# Patient Record
Sex: Male | Born: 1990 | Race: Black or African American | Hispanic: No | Marital: Married | Smoking: Never smoker
Health system: Southern US, Community
[De-identification: ages and names within clinical notes are randomized; demographics above are authoritative.]

## PROBLEM LIST (undated history)

## (undated) DIAGNOSIS — M459 Ankylosing spondylitis of unspecified sites in spine: Secondary | ICD-10-CM

## (undated) DIAGNOSIS — G8929 Other chronic pain: Secondary | ICD-10-CM

## (undated) DIAGNOSIS — M25559 Pain in unspecified hip: Secondary | ICD-10-CM

## (undated) HISTORY — PX: WISDOM TOOTH EXTRACTION: SHX21

---
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
## 2009-08-27 ENCOUNTER — Encounter: Admission: RE | Admit: 2009-08-27 | Discharge: 2009-08-27 | Payer: Self-pay | Admitting: Family Medicine

## 2009-10-01 ENCOUNTER — Encounter: Admission: RE | Admit: 2009-10-01 | Discharge: 2009-10-14 | Payer: Self-pay | Admitting: Orthopedic Surgery

## 2011-09-20 LAB — I-STAT 8, (EC8 V) (CONVERTED LAB)
BUN: 15
Bicarbonate: 24.3 — ABNORMAL HIGH
Chloride: 107
Glucose, Bld: 94
HCT: 43
Hemoglobin: 14.6
Operator id: 198171
Potassium: 3.8
Sodium: 140
TCO2: 25
pCO2, Ven: 36.9 — ABNORMAL LOW
pH, Ven: 7.427 — ABNORMAL HIGH

## 2011-09-20 LAB — D-DIMER, QUANTITATIVE: D-Dimer, Quant: <0.22

## 2011-09-20 LAB — POCT I-STAT CREATININE: Operator id: 198171

## 2014-06-14 ENCOUNTER — Emergency Department (INDEPENDENT_AMBULATORY_CARE_PROVIDER_SITE_OTHER)
Admission: EM | Admit: 2014-06-14 | Discharge: 2014-06-14 | Disposition: A | Discharge status: Home / Self Care | Payer: Self-pay | Source: Home / Self Care | Attending: Family Medicine | Admitting: Family Medicine

## 2014-06-14 ENCOUNTER — Encounter (HOSPITAL_COMMUNITY): Payer: Self-pay | Admitting: Emergency Medicine

## 2014-06-14 DIAGNOSIS — R059 Cough, unspecified: Secondary | ICD-10-CM

## 2014-06-14 DIAGNOSIS — R0609 Other forms of dyspnea: Secondary | ICD-10-CM

## 2014-06-14 DIAGNOSIS — R05 Cough: Secondary | ICD-10-CM

## 2014-06-14 DIAGNOSIS — R0683 Snoring: Secondary | ICD-10-CM

## 2014-06-14 DIAGNOSIS — R51 Headache: Secondary | ICD-10-CM

## 2014-06-14 DIAGNOSIS — R053 Chronic cough: Secondary | ICD-10-CM

## 2014-06-14 DIAGNOSIS — G8929 Other chronic pain: Secondary | ICD-10-CM

## 2014-06-14 DIAGNOSIS — R0989 Other specified symptoms and signs involving the circulatory and respiratory systems: Secondary | ICD-10-CM

## 2014-06-14 MED ORDER — OMEPRAZOLE 40 MG PO CPDR: 40.0000 mg | DELAYED_RELEASE_CAPSULE | Freq: Every day | ORAL | Status: DC

## 2014-06-14 MED ORDER — FLUTICASONE PROPIONATE 50 MCG/ACT NA SUSP: 2.0000 | Freq: Every day | NASAL | Status: DC

## 2014-06-14 NOTE — ED Provider Notes (Signed)
Cristian Mercado is a 23 y.o. male who presents to Urgent Care today for cough. Patient notes a chronic cough occurring for the last 3 or 4 months. The cough is worse at night and is associated with acid reflux symptoms. Use she notes a mild headache occurring for the last several months associated with cough as well. His partner notes that he snores loudly. No fevers or chills nausea vomiting or diarrhea. Mucinex and honey have not been helpful. No wheezing or history of asthma. No weakness or numbness loss of function or balance. Normal coordination.   History reviewed. No pertinent past medical history. History  Substance Use Topics  . Smoking status: Never Smoker   . Smokeless tobacco: Not on file  . Alcohol Use: No   ROS as above Medications: No current facility-administered medications for this encounter.   Current Outpatient Prescriptions  Medication Sig Dispense Refill  . fluticasone (FLONASE) 50 MCG/ACT nasal spray Place 2 sprays into both nostrils daily.  16 g  3  . omeprazole (PRILOSEC) 40 MG capsule Take 1 capsule (40 mg total) by mouth daily.  30 capsule  3    Exam:  BP 128/81  Pulse 70  Temp(Src) 98.7 F (37.1 C) (Oral)  Resp 16  SpO2 97% Gen: Well NAD HEENT: EOMI,  MMM posterior pharynx with tonsillar hypertrophy bilaterally and cobblestoning. Tympanic membranes are normal appearing bilaterally. Lungs: Normal work of breathing. CTABL Heart: RRR no MRG Abd: NABS, Soft. NT, ND Exts: Brisk capillary refill, warm and well perfused.  Neuro: Alert and oriented normal gait balance and coordination.  No results found for this or any previous visit (from the past 24 hour(s)). No results found.  Assessment and Plan: 23 y.o. male with chronic cough likely due to acid reflux with possible postnasal drip. Will treat with omeprazole and Flonase nasal spray. Recommend patient followup with ear nose and throat when he has health insurance to consider tonsillectomy for possible  sleep apnea. Watchful waiting for headaches. Hopeful for resolution with resolution of cough.  Discussed warning signs or symptoms. Please see discharge instructions. Patient expresses understanding.    Rodolph BongEvan S Jerome Viglione, MD 06/14/14 [id number]

## 2014-06-14 NOTE — Discharge Instructions (Signed)
Thank you for coming in today. Call or go to the emergency room if you get worse, have trouble breathing, have chest pains, or palpitations.  Go to the emergency room if your headache becomes excruciating or you have weakness or numbness or uncontrolled vomiting.   Please call or see Ms Antionette Char for assistance with your bill.  You may qualify for reduced or free services.  Her phone number is [phone]. Her email is [email or web address]   Gastroesophageal Reflux Disease, Adult Gastroesophageal reflux disease (GERD) happens when acid from your stomach flows up into the esophagus. When acid comes in contact with the esophagus, the acid causes soreness (inflammation) in the esophagus. Over time, GERD may create small holes (ulcers) in the lining of the esophagus. CAUSES   Increased body weight. This puts pressure on the stomach, making acid rise from the stomach into the esophagus.  Smoking. This increases acid production in the stomach.  Drinking alcohol. This causes decreased pressure in the lower esophageal sphincter (valve or ring of muscle between the esophagus and stomach), allowing acid from the stomach into the esophagus.  Late evening meals and a full stomach. This increases pressure and acid production in the stomach.  A malformed lower esophageal sphincter. Sometimes, no cause is found. SYMPTOMS   Burning pain in the lower part of the mid-chest behind the breastbone and in the mid-stomach area. This may occur twice a week or more often.  Trouble swallowing.  Sore throat.  Dry cough.  Asthma-like symptoms including chest tightness, shortness of breath, or wheezing. DIAGNOSIS  Your caregiver may be able to diagnose GERD based on your symptoms. In some cases, X-rays and other tests may be done to check for complications or to check the condition of your stomach and esophagus. TREATMENT  Your caregiver may recommend over-the-counter or prescription  medicines to help decrease acid production. Ask your caregiver before starting or adding any new medicines.  HOME CARE INSTRUCTIONS   Change the factors that you can control. Ask your caregiver for guidance concerning weight loss, quitting smoking, and alcohol consumption.  Avoid foods and drinks that make your symptoms worse, such as:  Caffeine or alcoholic drinks.  Chocolate.  Peppermint or mint flavorings.  Garlic and onions.  Spicy foods.  Citrus fruits, such as oranges, lemons, or limes.  Tomato-based foods such as sauce, chili, salsa, and pizza.  Fried and fatty foods.  Avoid lying down for the 3 hours prior to your bedtime or prior to taking a nap.  Eat small, frequent meals instead of large meals.  Wear loose-fitting clothing. Do not wear anything tight around your waist that causes pressure on your stomach.  Raise the head of your bed 6 to 8 inches with wood blocks to help you sleep. Extra pillows will not help.  Only take over-the-counter or prescription medicines for pain, discomfort, or fever as directed by your caregiver.  Do not take aspirin, ibuprofen, or other nonsteroidal anti-inflammatory drugs (NSAIDs). SEEK IMMEDIATE MEDICAL CARE IF:   You have pain in your arms, neck, jaw, teeth, or back.  Your pain increases or changes in intensity or duration.  You develop nausea, vomiting, or sweating (diaphoresis).  You develop shortness of breath, or you faint.  Your vomit is green, yellow, black, or looks like coffee grounds or blood.  Your stool is red, bloody, or black. These symptoms could be signs of other problems, such as heart disease, gastric bleeding, or esophageal bleeding. MAKE SURE YOU:   Understand these instructions.  Will watch your condition.  Will get help right away if you are not doing well or get worse. Document Released: 09/08/2005 Document Revised: 02/21/2012 Document Reviewed: 06/18/2011 [facility] Patient Information 2015  [place], [place]. This information is not intended to replace advice given to you by your health care provider. Make sure you discuss any questions you have with your health care provider.   Cough, Adult  A cough is a reflex that helps clear your throat and airways. It can help heal the body or may be a reaction to an irritated airway. A cough may only last 2 or 3 weeks (acute) or may last more than 8 weeks (chronic).  CAUSES Acute cough:  Viral or bacterial infections. Chronic cough:  Infections.  Allergies.  Asthma.  Post-nasal drip.  Smoking.  Heartburn or acid reflux.  Some medicines.  Chronic lung problems (COPD).  Cancer. SYMPTOMS   Cough.  Fever.  Chest pain.  Increased breathing rate.  High-pitched whistling sound when breathing (wheezing).  Colored mucus that you cough up (sputum). TREATMENT   A bacterial cough may be treated with antibiotic medicine.  A viral cough must run its course and will not respond to antibiotics.  Your caregiver may recommend other treatments if you have a chronic cough. HOME CARE INSTRUCTIONS   Only take over-the-counter or prescription medicines for pain, discomfort, or fever as directed by your caregiver. Use cough suppressants only as directed by your caregiver.  Use a cold steam vaporizer or humidifier in your bedroom or home to help loosen secretions.  Sleep in a semi-upright position if your cough is worse at night.  Rest as needed.  Stop smoking if you smoke. SEEK IMMEDIATE MEDICAL CARE IF:   You have pus in your sputum.  Your cough starts to worsen.  You cannot control your cough with suppressants and are losing sleep.  You begin coughing up blood.  You have difficulty breathing.  You develop pain which is getting worse or is uncontrolled with medicine.  You have a fever. MAKE SURE YOU:   Understand these instructions.  Will watch your condition.  Will get help right away if you are not doing well  or get worse. Document Released: 05/28/2011 Document Revised: 02/21/2012 Document Reviewed: 05/28/2011 [facility] Patient Information 2015 [place], [place]. This information is not intended to replace advice given to you by your health care provider. Make sure you discuss any questions you have with your health care provider.  General Headache Without Cause A headache is pain or discomfort felt around the head or neck area. The specific cause of a headache may not be found. There are many causes and types of headaches. A few common ones are:  Tension headaches.  Migraine headaches.  Cluster headaches.  Chronic daily headaches. HOME CARE INSTRUCTIONS   Keep all follow-up appointments with your caregiver or any specialist referral.  Only take over-the-counter or prescription medicines for pain or discomfort as directed by your caregiver.  Lie down in a dark, quiet room when you have a headache.  Keep a headache journal to find out what may trigger your migraine headaches. For example, write down:  What you eat and drink.  How much sleep you get.  Any change to your diet or medicines.  Try massage or other relaxation techniques.  Put ice packs or heat on the head and neck. Use these 3 to 4 times per day for 15 to 20 minutes each time, or as needed.  Limit stress.  Sit up  straight, and do not tense your muscles.  Quit smoking if you smoke.  Limit alcohol use.  Decrease the amount of caffeine you drink, or stop drinking caffeine.  Eat and sleep on a regular schedule.  Get 7 to 9 hours of sleep, or as recommended by your caregiver.  Keep lights dim if bright lights bother you and make your headaches worse. SEEK MEDICAL CARE IF:   You have problems with the medicines you were prescribed.  Your medicines are not working.  You have a change from the usual headache.  You have nausea or vomiting. SEEK IMMEDIATE MEDICAL CARE IF:   Your headache becomes severe.  You have  a fever.  You have a stiff neck.  You have loss of vision.  You have muscular weakness or loss of muscle control.  You start losing your balance or have trouble walking.  You feel faint or pass out.  You have severe symptoms that are different from your first symptoms. MAKE SURE YOU:   Understand these instructions.  Will watch your condition.  Will get help right away if you are not doing well or get worse. Document Released: 11/29/2005 Document Revised: 02/21/2012 Document Reviewed: 12/15/2011 [facility] Patient Information 2015 [place], [place]. This information is not intended to replace advice given to you by your health care provider. Make sure you discuss any questions you have with your health care provider.

## 2014-06-14 NOTE — ED Notes (Signed)
C/o cough since March.  C/o headaches every other day since March.  Has tried cough syrup, honey and tea without relief.

## 2014-06-15 NOTE — ED Notes (Signed)
Patient called, asked for one more day extension of her RTW note. Granted, given Dr Denyse Amassorey history of granting time off of 1-3 days

## 2014-08-02 ENCOUNTER — Encounter (HOSPITAL_COMMUNITY): Payer: Self-pay | Admitting: Emergency Medicine

## 2014-08-02 ENCOUNTER — Emergency Department (HOSPITAL_COMMUNITY)
Admission: EM | Admit: 2014-08-02 | Discharge: 2014-08-02 | Disposition: A | Discharge status: Home / Self Care | Payer: Self-pay | Attending: Emergency Medicine | Admitting: Emergency Medicine

## 2014-08-02 ENCOUNTER — Emergency Department (HOSPITAL_COMMUNITY): Payer: Self-pay

## 2014-08-02 DIAGNOSIS — IMO0002 Reserved for concepts with insufficient information to code with codable children: Secondary | ICD-10-CM | POA: Insufficient documentation

## 2014-08-02 DIAGNOSIS — Z79899 Other long term (current) drug therapy: Secondary | ICD-10-CM | POA: Insufficient documentation

## 2014-08-02 DIAGNOSIS — Y9389 Activity, other specified: Secondary | ICD-10-CM | POA: Insufficient documentation

## 2014-08-02 DIAGNOSIS — Y9289 Other specified places as the place of occurrence of the external cause: Secondary | ICD-10-CM | POA: Insufficient documentation

## 2014-08-02 DIAGNOSIS — S0993XA Unspecified injury of face, initial encounter: Secondary | ICD-10-CM | POA: Insufficient documentation

## 2014-08-02 DIAGNOSIS — S199XXA Unspecified injury of neck, initial encounter: Secondary | ICD-10-CM

## 2014-08-02 DIAGNOSIS — W11XXXA Fall on and from ladder, initial encounter: Secondary | ICD-10-CM | POA: Insufficient documentation

## 2014-08-02 MED ORDER — NAPROXEN 500 MG PO TABS: 500.0000 mg | ORAL_TABLET | Freq: Two times a day (BID) | ORAL | Status: DC

## 2014-08-02 NOTE — Discharge Instructions (Signed)
You have been seen today for your complaint of pain after Fall Your imaging showed no fracture or abnormality. Your discharge medications include 1)NAPROXEN- please take your medication with food. Home care instructions are as follows:  Put ice on the injured area.  Put ice in a plastic bag.  Place a towel between your skin and the bag.  Leave the ice on for 15 to 20 minutes, 3 to 4 times a day.  Drink enough fluids to keep your urine clear or pale yellow. Do not drink alcohol.  Take a warm shower or bath once or twice a day. This will increase blood flow to sore muscles.  You may return to activities as directed by your caregiver. Be careful when lifting, as this may aggravate neck or back pain.  Only take over-the-counter or prescription medicines for pain, discomfort, or fever as directed by your caregiver. Do not use aspirin. This may increase bruising and bleeding.  Follow up with: Dr. Beverely LowPeter Kwiatowski or return to the emergency department Please seek immediate medical care if you develop any of the following symptoms: SEEK IMMEDIATE MEDICAL CARE IF:  You have numbness, tingling, or weakness in the arms or legs.  You develop severe headaches not relieved with medicine.  You have severe neck pain, especially tenderness in the middle of the back of your neck.  You have changes in bowel or bladder control.  There is increasing pain in any area of the body.  You have shortness of breath, lightheadedness, dizziness, or fainting.  You have chest pain.  You feel sick to your stomach (nauseous), throw up (vomit), or sweat.  You have increasing abdominal discomfort.  There is blood in your urine, stool, or vomit.  You have pain in your shoulder (shoulder strap areas).  You feel your symptoms are getting worse.

## 2014-08-02 NOTE — ED Notes (Signed)
Pt to FT with RN who will complete triage

## 2014-08-02 NOTE — ED Provider Notes (Signed)
CSN: [id number]     Arrival date & time 08/02/14  1244 History  This chart was scribed for non-physician practitioner [name], PA-C working with [name], DO by [name], ED Scribe. This patient was seen in room TR06C/TR06C and the patient's care was started at 1:15 PM.     Chief Complaint  Patient presents with  . Fall   The history is provided by the patient. No language interpreter was used.    HPI Comments: Cristian Mercado is a 23 y.o. male who presents to the Emergency Department complaining of constant, gradually worsening left posterior shoulder pain and right buttocks pain for the past 4 days. Patient states this pain began after a fall; he states he fell from a height of 8 ft while cleaning gutters, striking his back. He denies head injury or LOC. He has been taking Advil 400 mg each day with minimal relief. He denies numbness, weakness, HA, nausea.   History reviewed. No pertinent past medical history. History reviewed. No pertinent past surgical history. No family history on file. History  Substance Use Topics  . Smoking status: Never Smoker   . Smokeless tobacco: Not on file  . Alcohol Use: No    Review of Systems  Musculoskeletal: Positive for back pain. Negative for neck pain.  Skin: Negative for wound.  Neurological: Negative for syncope and weakness.      Allergies  Review of patient's allergies indicates no known allergies.  Home Medications   Prior to Admission medications   Medication Sig Start Date End Date Taking? Authorizing Provider  fluticasone (FLONASE) 50 MCG/ACT nasal spray Place 2 sprays into both nostrils daily. 06/14/14   Rodolph Bong, MD  omeprazole (PRILOSEC) 40 MG capsule Take 1 capsule (40 mg total) by mouth daily. 06/14/14   Rodolph Bong, MD   BP 127/83  Pulse 80  Temp(Src) 98.7 F (37.1 C) (Oral)  Resp 16  SpO2 99% Physical Exam  Nursing note and vitals reviewed. Constitutional: He is oriented to person, place, and time.  He appears well-developed and well-nourished.  HENT:  Head: Normocephalic and atraumatic.  Eyes: EOM are normal.  Neck: Normal range of motion. Neck supple. No tracheal deviation present.  Cardiovascular: Normal rate.   Pulmonary/Chest: Effort normal.  Abdominal: He exhibits no distension.  Musculoskeletal: He exhibits tenderness.  Midline thoracic spine tenderness about T2-3. Pain with movement of the left shoulder and flexion of the neck. No scapular tenderness. No midline cervical tenderness.   Tenderness over posterior right iliac crest. Full strength with flexion and extension, although this causes pain. Pain with passive flexion, extension, and external rotation predominately at the site of the ischial tuberosity. Able to stand and walks with antalgic gait. NVI.   Neurological: He is alert and oriented to person, place, and time.  Skin: Skin is warm and dry.  Psychiatric: He has a normal mood and affect.    ED Course  Procedures (including critical care time)  DIAGNOSTIC STUDIES: Oxygen Saturation is 99% on RA, normal by my interpretation.    COORDINATION OF CARE: 1:24 PM Discussed treatment plan with pt at bedside and pt agreed to plan.   Labs Review Labs Reviewed - No data to display  Imaging Review No results found.   EKG Interpretation None      MDM   Final diagnoses:  Fall from ladder, initial encounter    Patient without signs of serious head, neck, or back injury. Normal neurological exam. No concern for  closed head injury, lung injury, or intraabdominal injury. Normal muscle soreness after fall.  D/t pts normal radiology & ability to ambulate in ED pt will be dc home with symptomatic therapy. Pt has been instructed to follow up with their doctor if symptoms persist. Home conservative therapies for pain including ice and heat tx have been discussed. Pt is hemodynamically stable, in NAD, & able to ambulate in the ED. Pain has been managed & has no complaints  prior to dc.   I personally performed the services described in this documentation, which was scribed in my presence. The recorded information has been reviewed and is accurate.     Arthor CaptainAbigail Nylee Barbuto, PA-C 08/06/14 [phone]

## 2014-08-02 NOTE — ED Notes (Signed)
Pain in shoulder and hip since falling off a ladder Monday.  Fall from 10+ feet.  Pt continues to have soreness, ambulatory in triage

## 2014-08-02 NOTE — ED Notes (Signed)
Pt fell off a ladder approx 8 ft, on Monday. C/O right upper back/shoulder pain. Also has right posterior thigh pain. Ambulates without difficulty. No LOC.

## 2014-08-06 NOTE — ED Provider Notes (Signed)
Medical screening examination/treatment/procedure(s) were performed by non-physician practitioner and as supervising physician I was immediately available for consultation/collaboration.   EKG Interpretation None        Anavi Branscum, DO 08/06/14 1741 

## 2014-09-04 ENCOUNTER — Emergency Department (HOSPITAL_COMMUNITY)
Admission: EM | Admit: 2014-09-04 | Discharge: 2014-09-04 | Disposition: A | Discharge status: Home / Self Care | Payer: Self-pay | Attending: Emergency Medicine | Admitting: Emergency Medicine

## 2014-09-04 ENCOUNTER — Encounter (HOSPITAL_COMMUNITY): Payer: Self-pay | Admitting: Emergency Medicine

## 2014-09-04 DIAGNOSIS — R590 Localized enlarged lymph nodes: Secondary | ICD-10-CM

## 2014-09-04 DIAGNOSIS — Y9289 Other specified places as the place of occurrence of the external cause: Secondary | ICD-10-CM | POA: Insufficient documentation

## 2014-09-04 DIAGNOSIS — Y9389 Activity, other specified: Secondary | ICD-10-CM | POA: Insufficient documentation

## 2014-09-04 DIAGNOSIS — T20112A Burn of first degree of left ear [any part, except ear drum], initial encounter: Secondary | ICD-10-CM

## 2014-09-04 DIAGNOSIS — X19XXXA Contact with other heat and hot substances, initial encounter: Secondary | ICD-10-CM | POA: Insufficient documentation

## 2014-09-04 DIAGNOSIS — T20219A Burn of second degree of unspecified ear [any part, except ear drum], initial encounter: Secondary | ICD-10-CM | POA: Insufficient documentation

## 2014-09-04 MED ORDER — CEPHALEXIN 500 MG PO CAPS: 500.0000 mg | ORAL_CAPSULE | Freq: Three times a day (TID) | ORAL | Status: DC

## 2014-09-04 MED ORDER — CEPHALEXIN 500 MG PO CAPS
500.0000 mg | ORAL_CAPSULE | Freq: Once | ORAL | Status: AC
  Administered 2014-09-04: 500 mg via ORAL
  Filled 2014-09-04: qty 1

## 2014-09-04 MED ORDER — MUPIROCIN CALCIUM 2 % EX CREA
TOPICAL_CREAM | Freq: Two times a day (BID) | CUTANEOUS | Status: DC
  Administered 2014-09-04: 05:00:00 via TOPICAL
  Filled 2014-09-04: qty 15

## 2014-09-04 NOTE — ED Notes (Signed)
Pt. reports left outer ear skin itching with mild swelling onset yesterday , no drainage / denies fever .

## 2014-09-04 NOTE — Discharge Instructions (Signed)
Burn Care Burns hurt your skin. When your skin is hurt, it is easier to get an infection. Follow your doctor's directions to help prevent an infection. HOME CARE  Wash your hands well before you change your bandage.  Change your bandage as often as told by your doctor.  Remove the old bandage. If the bandage sticks, soak it off with cool, clean water.  Gently clean the burn with mild soap and water.  Pat the burn dry with a clean, dry cloth.  Put a thin layer of medicated cream on the burn.  Put a clean bandage on as told by your doctor.  Keep the bandage clean and dry.  Raise (elevate) the burn for the first 24 hours. After that, follow your doctor's directions.  Only take medicine as told by your doctor. GET HELP RIGHT AWAY IF:   You have too much pain.  The skin near the burn is red, tender, puffy (swollen), or has red streaks.  The burn area has yellowish white fluid (pus) or a bad smell coming from it.  You have a fever. MAKE SURE YOU:   Understand these instructions.  Will watch your condition.  Will get help right away if you are not doing well or get worse. Document Released: 09/07/2008 Document Revised: 02/21/2012 Document Reviewed: 04/21/2011 [facility] Patient Information 2015 [place], [place]. This information is not intended to replace advice given to you by your health care provider. Make sure you discuss any questions you have with your health care provider. Please apply a small amount of Bactroban ointment daily top of your left ear twice a day to keep the area moist.  It also been given a prescription for antibiotic take this as directed and to all tablets have been consumed. Return anytime, you develop fever, worsening pain, swelling, pus from the, area of the burn

## 2014-09-04 NOTE — ED Provider Notes (Signed)
CSN: [id number]     Arrival date & time 09/04/14  0327 History   First MD Initiated Contact with Patient 09/04/14 0408     No chief complaint on file.    (Consider location/radiation/quality/duration/timing/severity/associated sxs/prior Treatment) HPI Comments: After getting a haircut.  This, afternoon.  Patient noticed, that the upper aspect of his left ear started swelling.  He developed a painful lymph node in the posterior auricular area.  Denies any fever, trauma previous history of same. Patient does, state that when he was getting a haircut that helps his ear get warm from the clippers  The history is provided by the patient.    History reviewed. No pertinent past medical history. History reviewed. No pertinent past surgical history. No family history on file. History  Substance Use Topics  . Smoking status: Never Smoker   . Smokeless tobacco: Not on file  . Alcohol Use: No    Review of Systems  Constitutional: Negative for fever.  HENT: Positive for ear pain. Negative for ear discharge, facial swelling, sore throat and trouble swallowing.   Skin: Positive for wound.  Neurological: Negative for numbness.  All other systems reviewed and are negative.     Allergies  Review of patient's allergies indicates no known allergies.  Home Medications   Prior to Admission medications   Medication Sig Start Date End Date Taking? Authorizing Provider  cephALEXin (KEFLEX) 500 MG capsule Take 1 capsule (500 mg total) by mouth 3 (three) times daily. 09/04/14   Arman Filter, NP   BP 132/80  Pulse 68  Temp(Src) 97.7 F (36.5 C) (Oral)  Resp 18  SpO2 99% Physical Exam  Nursing note and vitals reviewed. Constitutional: He appears well-developed and well-nourished.  HENT:  Head: Normocephalic.  Right Ear: External ear normal.  Left Ear: No drainage. No mastoid tenderness. No decreased hearing is noted.  Ears:  Eyes: Pupils are equal, round, and reactive to light.  Neck:  Normal range of motion. Neck supple.  Cardiovascular: Normal rate and regular rhythm.   Musculoskeletal: Normal range of motion.  Lymphadenopathy:       Head (left side): Posterior auricular adenopathy present. No preauricular adenopathy present.  Neurological: He is alert.  Skin: Skin is warm and dry. Rash noted.  The pinna of his left ear it appears to be burned with fluctuant.  Blisters.  Minimal surrounding erythema and pain  Psychiatric: He has a normal mood and affect.    ED Course  Procedures (including critical care time) Labs Review Labs Reviewed - No data to display  Imaging Review No results found.   EKG Interpretation None     Due to the appearance, distribution, and location of the blisters I. doubt this is shingles.  Most likely a burn from the clippers sustained during haircut.  This, afternoon. Patient has been given Bactroban to apply topically twice a day, as well, as Keflex 500 mg 3 times a day for 5 days to to the lymph node enlargement MDM   Final diagnoses:  Burn erythema of ear, left, initial encounter  Localized enlarged lymph nodes        Arman Filter, NP 09/04/14 [phone]

## 2014-09-04 NOTE — ED Provider Notes (Signed)
Medical screening examination/treatment/procedure(s) were performed by non-physician practitioner and as supervising physician I was immediately available for consultation/collaboration.   EKG Interpretation None        Tomasita Crumble, MD 09/04/14 1827

## 2014-09-05 ENCOUNTER — Emergency Department (HOSPITAL_COMMUNITY)
Admission: EM | Admit: 2014-09-05 | Discharge: 2014-09-05 | Disposition: A | Discharge status: Home / Self Care | Payer: Self-pay | Attending: Emergency Medicine | Admitting: Emergency Medicine

## 2014-09-05 ENCOUNTER — Encounter (HOSPITAL_COMMUNITY): Payer: Self-pay | Admitting: Emergency Medicine

## 2014-09-05 DIAGNOSIS — H938X9 Other specified disorders of ear, unspecified ear: Secondary | ICD-10-CM | POA: Insufficient documentation

## 2014-09-05 DIAGNOSIS — H60399 Other infective otitis externa, unspecified ear: Secondary | ICD-10-CM | POA: Insufficient documentation

## 2014-09-05 DIAGNOSIS — H6012 Cellulitis of left external ear: Secondary | ICD-10-CM

## 2014-09-05 DIAGNOSIS — Z792 Long term (current) use of antibiotics: Secondary | ICD-10-CM | POA: Insufficient documentation

## 2014-09-05 MED ORDER — SULFAMETHOXAZOLE-TRIMETHOPRIM 800-160 MG PO TABS: 1.0000 | ORAL_TABLET | Freq: Two times a day (BID) | ORAL | Status: AC

## 2014-09-05 NOTE — ED Provider Notes (Signed)
CSN: [id number]     Arrival date & time 09/05/14  2124 History   This chart was scribed for a non-physician practitioner, Junius Finner, PA-C working with Raeford Razor, MD by [place] Peace, ED Scribe. The patient was seen in TR10C/TR10C. The patient's care was started at 10:16 PM.    Chief Complaint  Patient presents with  . Ear Swelling       The history is provided by the patient. No language interpreter was used.   HPI Comments: Cristian Mercado is a 23 y.o. male who presents to the Emergency Department complaining of continued left ear pain onset 2 days ago with associated swelling. Pt states that he was getting his haircut on Tuesday and his barber accidentally cut him on his left ear which led to him getting an infection. Pt states that he was seen at ED early Wednesday morning for same problem and was given antibiotics but denies any improvement in symptoms. He notes formation of "blisters" on top of his ear. Pt is non-smoker. Pt states pain is burning and aching, 7/10.  States he is concerned there is an abscess that needs to be drained.  Denies fever, n/v/d.    History reviewed. No pertinent past medical history. History reviewed. No pertinent past surgical history. No family history on file. History  Substance Use Topics  . Smoking status: Never Smoker   . Smokeless tobacco: Never Used  . Alcohol Use: No    Review of Systems  HENT: Positive for ear pain.        Ear swelling.   All other systems reviewed and are negative.     Allergies  Review of patient's allergies indicates no known allergies.  Home Medications   Prior to Admission medications   Medication Sig Start Date End Date Taking? Authorizing Provider  cephALEXin (KEFLEX) 500 MG capsule Take 1 capsule (500 mg total) by mouth 3 (three) times daily. 09/04/14   Arman Filter, NP  sulfamethoxazole-trimethoprim (BACTRIM DS,SEPTRA DS) 800-160 MG per tablet Take 1 tablet by mouth 2 (two) times daily. For 5 days  09/05/14 09/12/14  Junius Finner, PA-C   BP 116/72  Pulse 71  Temp(Src) 98.2 F (36.8 C) (Oral)  Resp 18  Ht  (1.676 m)  Wt 191 lb 3.2 oz (86.728 kg)  BMI 30.88 kg/m2  SpO2 97% Physical Exam  Nursing note and vitals reviewed. Constitutional: He is oriented to person, place, and time. He appears well-developed and well-nourished.  HENT:  Head: Normocephalic and atraumatic.  Right Ear: External ear normal.  Left Ear: External ear normal.  Erythema of auricle of left ear. Scab over helix with erythema and tenderness. Postauricular lymph nodes present, tender to palpation. No induration or fluctuance. No mastoid tenderness.     Eyes: EOM are normal.  Neck: Normal range of motion.  Cardiovascular: Normal rate.   Pulmonary/Chest: Effort normal.  Musculoskeletal: Normal range of motion.  Neurological: He is alert and oriented to person, place, and time.  Skin: Skin is warm and dry.  Psychiatric: He has a normal mood and affect. His behavior is normal.    ED Course  Procedures (including critical care time) Labs Review Labs Reviewed - No data to display  Results for orders placed during the hospital encounter of 11/21/07  D-DIMER, QUANTITATIVE      Result Value Ref Range   D-Dimer, Quant       Value: <0.22            AT  THE INHOUSE ESTABLISHED CUTOFF     VALUE OF 0.48 ug/mL FEU,     THIS ASSAY HAS BEEN DOCUMENTED     IN THE LITERATURE TO HAVE  I-STAT 8, (EC8 V) (CONVERTED LAB)      Result Value Ref Range   Operator id [id number]     Sodium 140     Potassium 3.8     Chloride 107     BUN 15     Glucose, Bld 94     pH, Ven 7.427 (*)    pCO2, Ven 36.9 (*)    Bicarbonate 24.3 (*)    TCO2 25     HCT 43.0     Hemoglobin 14.6     Sample type VENOUS    POCT I-STAT CREATININE      Result Value Ref Range   Operator id [id number]     Creatinine, Ser 1.1     No results found.    Imaging Review No results found.   EKG Interpretation None     Medications - No data to  display  10:30 PM- Treatment plan was discussed with patient who verbalizes understanding and agrees.   MDM   Final diagnoses:  Cellulitis of ear, left    Pt presenting to ED for recheck of cellulitis/burn to left ear that occurred 2 days ago.  Reports using keflex w/o relief. Pt concerned there is an abscess that needs to be drained. On exam, there is erythema to left ear with scab on helix of ear. palable post-auricular lymph nodes w/o surrounding induration or fluctuance. Normal TM and ear canal. Pt likely has not allowed keflex to work long enough, but will add bactrim x5 days. Encouraged pt to continue taking keflex as prescribed and to finish both antibiotics as prescribed. Advised to f/u in 3-4 days for recheck if symptoms not improving. Return precautions provided. Pt verbalized understanding and agreement with tx plan.   I personally performed the services described in this documentation, which was scribed in my presence. The recorded information has been reviewed and is accurate.   Junius Finner, PA-C 09/05/14 2258

## 2014-09-05 NOTE — ED Notes (Signed)
Patient presents stating he was seen for his left ear swelling.  The swelling has gone down but now there seems to be blisters on the top of his ear.  Started on antibiotics

## 2014-09-05 NOTE — Discharge Instructions (Signed)
Be sure to complete all of your antibiotics as prescribed even if you start feeling better.  See below for further instruction.  Cellulitis Cellulitis is an infection of the skin and the tissue under the skin. The infected area is usually red and tender. This happens most often in the arms and lower legs. HOME CARE   Take your antibiotic medicine as told. Finish the medicine even if you start to feel better.  Keep the infected arm or leg raised (elevated).  Put a warm cloth on the area up to 4 times per day.  Only take medicines as told by your doctor.  Keep all doctor visits as told. GET HELP IF:  You see red streaks on the skin coming from the infected area.  Your red area gets bigger or turns a dark color.  Your bone or joint under the infected area is painful after the skin heals.  Your infection comes back in the same area or different area.  You have a puffy (swollen) bump in the infected area.  You have new symptoms.  You have a fever. GET HELP RIGHT AWAY IF:   You feel very sleepy.  You throw up (vomit) or have watery poop (diarrhea).  You feel sick and have muscle aches and pains. MAKE SURE YOU:   Understand these instructions.  Will watch your condition.  Will get help right away if you are not doing well or get worse. Document Released: 05/17/2008 Document Revised: 04/15/2014 Document Reviewed: 02/14/2012 [facility] Patient Information 2015 [place], [place]. This information is not intended to replace advice given to you by your health care provider. Make sure you discuss any questions you have with your health care provider.

## 2014-09-06 NOTE — ED Provider Notes (Signed)
Medical screening examination/treatment/procedure(s) were performed by non-physician practitioner and as supervising physician I was immediately available for consultation/collaboration.   EKG Interpretation None       Venda Dice, MD 09/06/14 1500 

## 2014-10-11 ENCOUNTER — Emergency Department (HOSPITAL_COMMUNITY)
Admission: EM | Admit: 2014-10-11 | Discharge: 2014-10-12 | Discharge status: Against Medical Advice | Payer: Self-pay | Attending: Emergency Medicine | Admitting: Emergency Medicine

## 2014-10-11 ENCOUNTER — Encounter (HOSPITAL_COMMUNITY): Payer: Self-pay | Admitting: Emergency Medicine

## 2014-10-11 DIAGNOSIS — M79606 Pain in leg, unspecified: Secondary | ICD-10-CM | POA: Insufficient documentation

## 2014-10-11 DIAGNOSIS — M79659 Pain in unspecified thigh: Secondary | ICD-10-CM | POA: Insufficient documentation

## 2014-10-11 NOTE — ED Notes (Signed)
Bed: WTR6 Expected date:  Expected time:  Means of arrival:  Comments: 

## 2014-10-11 NOTE — ED Notes (Signed)
Pt states he has pain under right buttock and the back of his thigh.  Patient states he had to leave work early tonight because he stands at his job and the pain worsens when he stands.  States this thigh pain and leg pain has been intermittent over 5 years.  Patient also states he has been feeling dizzy at work and then "I eat candy and it goes away".

## 2014-10-12 ENCOUNTER — Encounter (HOSPITAL_COMMUNITY): Payer: Self-pay | Admitting: Emergency Medicine

## 2014-10-12 ENCOUNTER — Emergency Department (HOSPITAL_COMMUNITY)
Admission: EM | Admit: 2014-10-12 | Discharge: 2014-10-12 | Disposition: A | Discharge status: Home / Self Care | Payer: Self-pay | Attending: Emergency Medicine | Admitting: Emergency Medicine

## 2014-10-12 DIAGNOSIS — M25551 Pain in right hip: Secondary | ICD-10-CM | POA: Insufficient documentation

## 2014-10-12 LAB — CBG MONITORING, ED: GLUCOSE-CAPILLARY: 81 mg/dL (ref 70–99)

## 2014-10-12 MED ORDER — PREDNISONE 20 MG PO TABS: 40.0000 mg | ORAL_TABLET | Freq: Every day | ORAL | Status: DC

## 2014-10-12 MED ORDER — KETOROLAC TROMETHAMINE 60 MG/2ML IM SOLN
60.0000 mg | Freq: Once | INTRAMUSCULAR | Status: AC
  Administered 2014-10-12: 60 mg via INTRAMUSCULAR
  Filled 2014-10-12: qty 2

## 2014-10-12 MED ORDER — METHYLPREDNISOLONE SODIUM SUCC 125 MG IJ SOLR
125.0000 mg | Freq: Once | INTRAMUSCULAR | Status: AC
  Administered 2014-10-12: 125 mg via INTRAMUSCULAR
  Filled 2014-10-12: qty 2

## 2014-10-12 MED ORDER — OXYCODONE-ACETAMINOPHEN 5-325 MG PO TABS: 1.0000 | ORAL_TABLET | ORAL | Status: DC | PRN

## 2014-10-12 NOTE — ED Provider Notes (Signed)
Medical screening examination/treatment/procedure(s) were performed by non-physician practitioner and as supervising physician I was immediately available for consultation/collaboration.   EKG Interpretation None        Aditi Rovira M Amador Braddy, MD 10/12/14 1610 

## 2014-10-12 NOTE — ED Provider Notes (Signed)
CSN: [id number]     Arrival date & time 10/12/14  1057 History  This chart was scribed for non-physician practitioner, Sharilyn SitesLisa Mirka Barbone, PA-C working with Enid SkeensJoshua M Zavitz, MD, by Jarvis Morganaylor Ferguson, ED Scribe. This patient was seen in room TR05C/TR05C and the patient's care was started at 11:09 AM.    Chief Complaint  Patient presents with  . Hip Pain      The history is provided by the patient. No language interpreter was used.   HPI Comments: Cristian Mercado is a 23 y.o. male who presents to the Emergency Department complaining of severe joint pain in his right hip. Pt states that this has been intermittent for many years but has worsened over the past 3 days. He notes that the pain radiates to his lower back region. Pt reports that the pain is exacerbated by walking and any kind of movement involving moving of his hips. Pt states that it is difficult to walk and get out of bed.  He denies any known injury to the right hip. Pt notes that he has been doing a lot of bending and lifting at his job lately which may have exacerbated the pain. He reports [facility] past, about 4-5 years ago, for these same symptoms. He has not been seen by them recently. Pt was seen in August of this year by Arthor CaptainAbigail Harris, PA-C for similar symptoms. She ordered x-rays of his back and pelvis and found the pt to have sacroiliitis. He has not taken anything for the pain. He denies any fever, chills, color change or swelling.   No numbness, paresthesias, or weakness of lower extremities.  No loss of bowel or bladder control.  History reviewed. No pertinent past medical history. Past Surgical History  Procedure Laterality Date  . Wisdom tooth extraction     No family history on file. History  Substance Use Topics  . Smoking status: Never Smoker   . Smokeless tobacco: Never Used  . Alcohol Use: No    Review of Systems  Constitutional: Negative for fever and chills.  Musculoskeletal:  Positive for arthralgias (right hip). Negative for gait problem and joint swelling.  Skin: Negative for color change.  All other systems reviewed and are negative.     Allergies  Review of patient's allergies indicates no known allergies.  Home Medications   Prior to Admission medications   Medication Sig Start Date End Date Taking? Authorizing Provider  ibuprofen (ADVIL,MOTRIN) 200 MG tablet Take 400 mg by mouth every 6 (six) hours as needed for moderate pain.    Historical Provider, MD   Triage Vitals: BP 120/102  Pulse 68  Temp(Src) 97.7 F (36.5 C) (Oral)  Resp 20  SpO2 98%  Physical Exam  Nursing note and vitals reviewed. Constitutional: He is oriented to person, place, and time. He appears well-developed and well-nourished.  HENT:  Head: Normocephalic and atraumatic.  Mouth/Throat: Oropharynx is clear and moist.  Eyes: Conjunctivae and EOM are normal. Pupils are equal, round, and reactive to light.  Neck: Normal range of motion. Neck supple.  Cardiovascular: Normal rate, regular rhythm and normal heart sounds.   Pulmonary/Chest: Effort normal and breath sounds normal. No respiratory distress. He has no wheezes.  Musculoskeletal:       Right hip: He exhibits tenderness and bony tenderness.  Generalized pain and tenderness of right hip, worse along posterior and lateral aspect; no deformities noted; full ROM maintained but with some pain; normal strength and  sensation BLE; ambulating with steady gait  Neurological: He is alert and oriented to person, place, and time.  Skin: Skin is warm and dry.  Psychiatric: He has a normal mood and affect.    ED Course  Procedures (including critical care time)  DIAGNOSTIC STUDIES: Oxygen Saturation is 98% on RA, normal by my interpretation.    COORDINATION OF CARE: 11:22 AM- will order solu-medrol injection and Toradol injection.  Pt advised of plan for treatment and pt agrees.  Labs Review Labs Reviewed - No data to  display  Imaging Review No results found.   EKG Interpretation None      MDM   Final diagnoses:  Hip pain, right   23 y.o. M with right hip pain, hx of same intermittently over the past several years.  Seen in the ED august 2015 and had x-ray performed revealing sacroiliitis. On exam, patient has generalized pain of his right hip, worse along posterior and lateral aspects. He has no bony deformities noted.  His neurologic exam is nonfocal. He is ambulating with steady gait. 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  Doubt acute fx or dislocation without known injury, imaging deferred.  Patient given injection of Toradol and Solu-Medrol in the ED. Will be discharged home with Percocet and steroid taper. He will follow-up with orthopedics for further management.  Discussed plan with patient, he/she acknowledged understanding and agreed with plan of care.  Return precautions given for new or worsening symptoms.  I personally performed the services described in this documentation, which was scribed in my presence. The recorded information has been reviewed and is accurate.  Garlon HatchetLisa M Trever Streater, PA-C 10/12/14 1211  Garlon HatchetLisa M Dellie Piasecki, PA-C 10/12/14 [phone]

## 2014-10-12 NOTE — ED Notes (Signed)
Patient called for room-not in waiting area at this time-no answer

## 2014-10-12 NOTE — ED Notes (Signed)
Pt called, no response

## 2014-10-12 NOTE — ED Notes (Signed)
Has had right hip pain on and off for "years". Last 3 days, has worsened. No known injury.

## 2014-10-12 NOTE — Discharge Instructions (Signed)
Take the prescribed medication as directed. Follow-up with one of the orthopedic clinics in the area for further management. Return to the ED for new or worsening symptoms.

## 2014-10-23 ENCOUNTER — Emergency Department (HOSPITAL_COMMUNITY)
Admission: EM | Admit: 2014-10-23 | Discharge: 2014-10-23 | Disposition: A | Discharge status: Home / Self Care | Payer: Self-pay | Attending: Emergency Medicine | Admitting: Emergency Medicine

## 2014-10-23 ENCOUNTER — Encounter (HOSPITAL_COMMUNITY): Payer: Self-pay | Admitting: Emergency Medicine

## 2014-10-23 DIAGNOSIS — Z79899 Other long term (current) drug therapy: Secondary | ICD-10-CM | POA: Insufficient documentation

## 2014-10-23 DIAGNOSIS — Z7952 Long term (current) use of systemic steroids: Secondary | ICD-10-CM | POA: Insufficient documentation

## 2014-10-23 DIAGNOSIS — M79651 Pain in right thigh: Secondary | ICD-10-CM | POA: Insufficient documentation

## 2014-10-23 DIAGNOSIS — G8929 Other chronic pain: Secondary | ICD-10-CM | POA: Insufficient documentation

## 2014-10-23 DIAGNOSIS — M25551 Pain in right hip: Secondary | ICD-10-CM | POA: Insufficient documentation

## 2014-10-23 MED ORDER — KETOROLAC TROMETHAMINE 60 MG/2ML IM SOLN
60.0000 mg | Freq: Once | INTRAMUSCULAR | Status: AC
  Administered 2014-10-23: 60 mg via INTRAMUSCULAR
  Filled 2014-10-23: qty 2

## 2014-10-23 MED ORDER — CYCLOBENZAPRINE HCL 10 MG PO TABS: 10.0000 mg | ORAL_TABLET | Freq: Two times a day (BID) | ORAL | Status: DC | PRN

## 2014-10-23 MED ORDER — TRAMADOL HCL 50 MG PO TABS: 50.0000 mg | ORAL_TABLET | Freq: Four times a day (QID) | ORAL | Status: DC | PRN

## 2014-10-23 NOTE — ED Notes (Signed)
Rt hip opff and on for a whil;e and it has gotten worse was seen aND TREated but meds did not help cannot afford pcp

## 2014-10-23 NOTE — Discharge Planning (Signed)
Ariana Juul J. Lucretia RoersWood, RN, BSN, [employer] [phone] NCM spoke with pt who [place]CP. NCM discussed and provided written information for self pay PCPs, importance of PCP for f/u care.  NCM informed pt of  www.needymeds.org, discounted pharmacies and other [place] resources such as financial assistance, DSS and health department Reviewed resources for [place] PCPs like [facility], [facility] [address], [facility] family [facility], general medical clinics, [facility], etc.  NCM instructed on  [facility] out patient pharmacies, housing, and other resources in [place]. Pt voiced understanding and appreciation of resources provided.

## 2014-10-23 NOTE — ED Provider Notes (Signed)
CSN: [id number]     Arrival date & time 10/23/14  1114 History  This chart was scribed for non-physician practitioner, Junius FinnerErin O'Malley, PA-C, working with Purvis SheffieldForrest Harrison, MD by Charline BillsEssence Howell, ED Scribe. This patient was seen in room TR10C/TR10C and the patient's care was started at 11:56 AM.   Chief Complaint  Patient presents with  . Hip Pain   The history is provided by the patient. No language interpreter was used.   HPI Comments: Cristian Mercado is a 23 y.o. male who presents to the Emergency Department complaining of gradually worsening R hip pain. Pt reports intermittent pain over the past few years that has worsened over the past 2 weeks. Pt was seen on 10/12/14 for same symptom and was sent home with Percocet that improved pain. Pt has completed all prescribed medications. He states that he over-worked himself 2 days ago at work and has had constant pain since. He denies back pain. Pt has not followed up with ortho due to lack of insurance. No known allergies.   History reviewed. No pertinent past medical history. Past Surgical History  Procedure Laterality Date  . Wisdom tooth extraction     No family history on file. History  Substance Use Topics  . Smoking status: Never Smoker   . Smokeless tobacco: Never Used  . Alcohol Use: No    Review of Systems  Musculoskeletal: Positive for arthralgias. Negative for back pain.  All other systems reviewed and are negative.  Allergies  Review of patient's allergies indicates no known allergies.  Home Medications   Prior to Admission medications   Medication Sig Start Date End Date Taking? Authorizing Provider  cyclobenzaprine (FLEXERIL) 10 MG tablet Take 1 tablet (10 mg total) by mouth 2 (two) times daily as needed for muscle spasms. 10/23/14   Junius FinnerErin O'Malley, PA-C  ibuprofen (ADVIL,MOTRIN) 200 MG tablet Take 400 mg by mouth every 6 (six) hours as needed for moderate pain.    Historical Provider, MD  oxyCODONE-acetaminophen  (PERCOCET/ROXICET) 5-325 MG per tablet Take 1 tablet by mouth every 4 (four) hours as needed. 10/12/14   Garlon HatchetLisa M Sanders, PA-C  predniSONE (DELTASONE) 20 MG tablet Take 2 tablets (40 mg total) by mouth daily. Take 40 mg by mouth daily for 3 days, then 20mg  by mouth daily for 3 days, then 10mg  daily for 3 days 10/12/14   Garlon HatchetLisa M Sanders, PA-C  traMADol (ULTRAM) 50 MG tablet Take 1 tablet (50 mg total) by mouth every 6 (six) hours as needed. 10/23/14   Junius FinnerErin O'Malley, PA-C   Triage Vitals: BP 129/84 mmHg  Pulse 74  Temp(Src) 98.4 F (36.9 C) (Oral)  Resp 16  SpO2 99% Physical Exam  Constitutional: He is oriented to person, place, and time. He appears well-developed and well-nourished.  HENT:  Head: Normocephalic and atraumatic.  Eyes: EOM are normal.  Neck: Normal range of motion.  Cardiovascular: Normal rate.   Pulmonary/Chest: Effort normal.  Musculoskeletal: Normal range of motion.  Tenderness on R lateral thigh Increase pain with hip flexion and extension Full ROM of R knee  Neurological: He is alert and oriented to person, place, and time.  Skin: Skin is warm and dry.  No erythema or ecchymosis   Psychiatric: He has a normal mood and affect. His behavior is normal.  Nursing note and vitals reviewed.  ED Course  Procedures (including critical care time) DIAGNOSTIC STUDIES: Oxygen Saturation is 99% on RA, normal by my interpretation.    COORDINATION OF CARE: 11:59  PM-Discussed treatment plan which includes Toradol injection with pt at bedside and pt agreed to plan.   Labs Review Labs Reviewed - No data to display  Imaging Review No results found.   EKG Interpretation None      MDM   Final diagnoses:  Chronic right hip pain    Pt c/o chronic right hip pain worsened after "overdoing it" at work earlier this week.  Denies falls or injuries. Medical records and imaging reviewed. No focal neuro deficits. Will tx as muscular pain. Will discharge home with flexeril and  tramadol. Advised to f/u with PCP. Case Management spoke with pt to help him get [facility]. Return precautions provided. Pt verbalized understanding and agreement with tx plan.   I personally performed the services described in this documentation, which was scribed in my presence. The recorded information has been reviewed and is accurate.    Junius Finnerrin O'Malley, PA-C 10/23/14 1649  Purvis SheffieldForrest Harrison, MD 10/24/14 1041

## 2014-10-23 NOTE — Discharge Planning (Signed)
CARE MANAGEMENT ED NOTE 10/23/2014  Patient:  Margrett RudGUY,ROYANTA T   Account Number:  [id number]  Date Initiated:  10/23/2014  Documentation initiated by:  [facility],Etana Beets  Subjective/Objective Assessment:   23 y.o. male who presents to the Emergency Department complaining of severe joint pain in his right hip.     Subjective/Objective Assessment Detail:   Pt states that pain has been intermittent for many years but has worsened over the past 3 days. He notes that the pain radiates to his lower back region. Pt reports that the pain is exacerbated by walking and any kind of movement involving moving of his hips. Pt states that it is difficult to walk and get out of bed.  He denies any known injury to the right hip. Pt notes that he has been doing a lot of bending and lifting at his job lately which may have exacerbated the pain. He reports he was [facility] in the past, about 4-5 years ago, for these same symptoms.     Action/Plan:   x-rays of his back and pelvis and found the pt to have sacroiliitis.   Action/Plan Detail:   NCM spoke with pt who [place]. NCM discussed and provided written information for self pay PCPs (including [facility]), importance of PCP for f/u.   Anticipated DC Date:  10/23/2014     Status Recommendation to Physician:   Result of Recommendation:    Other ED Services  Consult Working Plan      Choice offered to / List presented to:            Status of service:  Completed, signed off  ED Comments:  Pt instructed to visit [facility] upon d/c from ER to establish PCP services.  ED Comments Detail:

## 2014-12-04 ENCOUNTER — Encounter (HOSPITAL_COMMUNITY): Payer: Self-pay | Admitting: *Deleted

## 2014-12-04 DIAGNOSIS — R05 Cough: Secondary | ICD-10-CM | POA: Insufficient documentation

## 2014-12-04 DIAGNOSIS — R112 Nausea with vomiting, unspecified: Secondary | ICD-10-CM | POA: Insufficient documentation

## 2014-12-04 DIAGNOSIS — M545 Low back pain: Secondary | ICD-10-CM | POA: Insufficient documentation

## 2014-12-04 DIAGNOSIS — Z7952 Long term (current) use of systemic steroids: Secondary | ICD-10-CM | POA: Insufficient documentation

## 2014-12-04 NOTE — ED Notes (Signed)
The pt  Is feeling bad for several days with body aches and n v.  Unknown temp

## 2014-12-05 ENCOUNTER — Emergency Department (HOSPITAL_COMMUNITY)
Admission: EM | Admit: 2014-12-05 | Discharge: 2014-12-05 | Disposition: A | Discharge status: Home / Self Care | Payer: Self-pay | Attending: Emergency Medicine | Admitting: Emergency Medicine

## 2014-12-05 ENCOUNTER — Emergency Department (HOSPITAL_COMMUNITY): Payer: Self-pay

## 2014-12-05 DIAGNOSIS — R059 Cough, unspecified: Secondary | ICD-10-CM

## 2014-12-05 DIAGNOSIS — R05 Cough: Secondary | ICD-10-CM

## 2014-12-05 DIAGNOSIS — M791 Myalgia, unspecified site: Secondary | ICD-10-CM

## 2014-12-05 LAB — URINALYSIS, ROUTINE W REFLEX MICROSCOPIC
BILIRUBIN URINE: NEGATIVE
Glucose, UA: NEGATIVE mg/dL
HGB URINE DIPSTICK: NEGATIVE
KETONES UR: NEGATIVE mg/dL
Leukocytes, UA: NEGATIVE
NITRITE: NEGATIVE
Protein, ur: NEGATIVE mg/dL
Specific Gravity, Urine: 1.027 (ref 1.005–1.030)
UROBILINOGEN UA: 1 mg/dL (ref 0.0–1.0)
pH: 8 (ref 5.0–8.0)

## 2014-12-05 MED ORDER — ONDANSETRON 4 MG PO TBDP
4.0000 mg | ORAL_TABLET | Freq: Once | ORAL | Status: AC
  Administered 2014-12-05: 4 mg via ORAL
  Filled 2014-12-05: qty 1

## 2014-12-05 MED ORDER — ONDANSETRON 4 MG PO TBDP: 4.0000 mg | ORAL_TABLET | Freq: Three times a day (TID) | ORAL | Status: DC | PRN

## 2014-12-05 MED ORDER — IBUPROFEN 400 MG PO TABS
600.0000 mg | ORAL_TABLET | Freq: Once | ORAL | Status: AC
  Administered 2014-12-05: 600 mg via ORAL
  Filled 2014-12-05 (×2): qty 1

## 2014-12-05 MED ORDER — IBUPROFEN 600 MG PO TABS: 600.0000 mg | ORAL_TABLET | Freq: Four times a day (QID) | ORAL | Status: DC | PRN

## 2014-12-05 NOTE — ED Provider Notes (Signed)
CSN: [id number]     Arrival date & time 12/04/14  2321 History   First MD Initiated Contact with Patient 12/05/14 0043     Chief Complaint  Patient presents with  . Generalized Body Aches     (Consider location/radiation/quality/duration/timing/severity/associated sxs/prior Treatment) HPI Comments: Patient states he works at the [employer] and has been working long hours  Now with low back pain , myalgias, occasional cough, vomiting X2 yesterday. Denies fever,URI , Hx asthma, diarrhea/constipation   The history is provided by the patient.    History reviewed. No pertinent past medical history. Past Surgical History  Procedure Laterality Date  . Wisdom tooth extraction     No family history on file. History  Substance Use Topics  . Smoking status: Never Smoker   . Smokeless tobacco: Never Used  . Alcohol Use: No    Review of Systems  Constitutional: Negative for fever and chills.  Respiratory: Negative for cough and shortness of breath.   Cardiovascular: Negative for chest pain.  Gastrointestinal: Positive for nausea and vomiting. Negative for abdominal pain.  Genitourinary: Negative for dysuria and flank pain.  Musculoskeletal: Positive for myalgias and back pain.  All other systems reviewed and are negative.     Allergies  Review of patient's allergies indicates no known allergies.  Home Medications   Prior to Admission medications   Medication Sig Start Date End Date Taking? Authorizing Provider  cyclobenzaprine (FLEXERIL) 10 MG tablet Take 10 mg by mouth 3 (three) times daily as needed for muscle spasms.    Historical Provider, MD  ibuprofen (ADVIL,MOTRIN) 600 MG tablet Take 1 tablet (600 mg total) by mouth every 6 (six) hours as needed. 12/05/14   Arman FilterGail K Bobby Barton, NP  ondansetron (ZOFRAN-ODT) 4 MG disintegrating tablet Take 1 tablet (4 mg total) by mouth every 8 (eight) hours as needed for nausea or vomiting. 12/05/14   Arman FilterGail K Stehanie Ekstrom, NP  oxyCODONE-acetaminophen  (PERCOCET/ROXICET) 5-325 MG per tablet Take 1 tablet by mouth every 4 (four) hours as needed. 10/12/14   Garlon HatchetLisa M Sanders, PA-C  predniSONE (DELTASONE) 20 MG tablet Take 2 tablets (40 mg total) by mouth daily. Take 40 mg by mouth daily for 3 days, then 20mg  by mouth daily for 3 days, then 10mg  daily for 3 days 10/12/14   Garlon HatchetLisa M Sanders, PA-C  traMADol (ULTRAM) 50 MG tablet Take 1 tablet (50 mg total) by mouth every 6 (six) hours as needed. 10/23/14   Junius FinnerErin O'Malley, PA-C   BP 113/84 mmHg  Pulse 72  Temp(Src) 98.8 F (37.1 C)  Resp 16  Ht 5\' 6"  (1.676 m)  Wt 197 lb (89.359 kg)  BMI 31.81 kg/m2  SpO2 100% Physical Exam  Constitutional: He is oriented to person, place, and time. He appears well-developed and well-nourished.  HENT:  Head: Normocephalic.  Mouth/Throat: Oropharynx is clear and moist.  Eyes: Pupils are equal, round, and reactive to light.  Neck: Normal range of motion.  Cardiovascular: Normal rate and regular rhythm.   Pulmonary/Chest: Effort normal and breath sounds normal. No respiratory distress. He has no wheezes.  Abdominal: Soft. Bowel sounds are normal. He exhibits no distension. There is no tenderness.  Musculoskeletal: Normal range of motion. He exhibits no edema or tenderness.  Neurological: He is alert and oriented to person, place, and time.  Skin: Skin is warm and dry.    ED Course  Procedures (including critical care time) Labs Review Labs Reviewed  URINALYSIS, ROUTINE W REFLEX MICROSCOPIC - Abnormal; Notable for  the following:    APPearance CLOUDY (*)    All other components within normal limits    Imaging Review No results found.   EKG Interpretation None      MDM   Final diagnoses:  Cough  Myalgia        Arman FilterGail K Kandice Schmelter, NP 12/05/14 0147  Tomasita CrumbleAdeleke Oni, MD 12/05/14 0700

## 2014-12-05 NOTE — Discharge Instructions (Signed)
Tonight your urine test and chest xray is normal as well as your vital signs Please take Ibuprofen for discomfort as well as Zofran for nausea if needed

## 2014-12-07 ENCOUNTER — Emergency Department (HOSPITAL_COMMUNITY)
Admission: EM | Admit: 2014-12-07 | Discharge: 2014-12-07 | Disposition: A | Discharge status: Home / Self Care | Payer: Self-pay

## 2014-12-07 NOTE — ED Notes (Signed)
Called, unable to locate at  This time.

## 2014-12-07 NOTE — ED Notes (Signed)
No answer when called for triage 

## 2014-12-17 ENCOUNTER — Emergency Department (HOSPITAL_COMMUNITY)
Admission: EM | Admit: 2014-12-17 | Discharge: 2014-12-17 | Disposition: A | Discharge status: Home / Self Care | Payer: Self-pay | Attending: Emergency Medicine | Admitting: Emergency Medicine

## 2014-12-17 ENCOUNTER — Encounter (HOSPITAL_COMMUNITY): Payer: Self-pay | Admitting: Emergency Medicine

## 2014-12-17 DIAGNOSIS — M5440 Lumbago with sciatica, unspecified side: Secondary | ICD-10-CM | POA: Insufficient documentation

## 2014-12-17 DIAGNOSIS — M545 Low back pain: Secondary | ICD-10-CM

## 2014-12-17 DIAGNOSIS — M5430 Sciatica, unspecified side: Secondary | ICD-10-CM

## 2014-12-17 MED ORDER — KETOROLAC TROMETHAMINE 60 MG/2ML IM SOLN
60.0000 mg | Freq: Once | INTRAMUSCULAR | Status: AC
  Administered 2014-12-17: 60 mg via INTRAMUSCULAR
  Filled 2014-12-17: qty 2

## 2014-12-17 NOTE — Discharge Instructions (Signed)
Return to the emergency room with worsening of symptoms, new symptoms or with symptoms that are concerning , especially fevers, loss of control of bladder or bowels, numbness or tingling around genital region or anus, weakness. RICE: Rest, Ice (three cycles of 20 mins on, 42mns off at least twice a day), compression/brace, elevation. Heating pad works well for back pain. Ibuprofen 4069m(2 tablets 20066mevery 5-6 hours for 3-5 days and then as needed for pain. Follow up with orthopedist. Call to make appointment. Call to establish care with wellness center.   Back Injury Prevention Back injuries can be extremely painful and difficult to heal. After having one back injury, you are much more likely to experience another later on. It is important to learn how to avoid injuring or re-injuring your back. The following tips can help you to prevent a back injury. PHYSICAL FITNESS  Exercise regularly and try to develop good tone in your abdominal muscles. Your abdominal muscles provide a lot of the support needed by your back.  Do aerobic exercises (walking, jogging, biking, swimming) regularly.  Do exercises that increase balance and strength (tai chi, yoga) regularly. This can decrease your risk of falling and injuring your back.  Stretch before and after exercising.  Maintain a healthy weight. The more you weigh, the more stress is placed on your back. For every pound of weight, 10 times that amount of pressure is placed on the back. DIET  Talk to your caregiver about how much calcium and vitamin D you need per day. These nutrients help to prevent weakening of the bones (osteoporosis). Osteoporosis can cause broken (fractured) bones that lead to back pain.  Include good sources of calcium in your diet, such as dairy products, green, leafy vegetables, and products with calcium added (fortified).  Include good sources of vitamin D in your diet, such as milk and foods that are fortified with  vitamin D.  Consider taking a nutritional supplement or a multivitamin if needed.  Stop smoking if you smoke. POSTURE  Sit and stand up straight. Avoid leaning forward when you sit or hunching over when you stand.  Choose chairs with good low back (lumbar) support.  If you work at a desk, sit close to your work so you do not need to lean over. Keep your chin tucked in. Keep your neck drawn back and elbows bent at a right angle. Your arms should look like the letter "L."  Sit high and close to the steering wheel when you drive. Add a lumbar support to your car seat if needed.  Avoid sitting or standing in one position for too long. Take breaks to get up, stretch, and walk around at least once every hour. Take breaks if you are driving for long periods of time.  Sleep on your side with your knees slightly bent, or sleep on your back with a pillow under your knees. Do not sleep on your stomach. LIFTING, TWISTING, AND REACHING  Avoid heavy lifting, especially repetitive lifting. If you must do heavy lifting:  Stretch before lifting.  Work slowly.  Rest between lifts.  Use carts and dollies to move objects when possible.  Make several small trips instead of carrying 1 heavy load.  Ask for help when you need it.  Ask for help when moving big, awkward objects.  Follow these steps when lifting:  Stand with your feet shoulder-width apart.  Get as close to the object as you can. Do not try to pick up heavy objects  that are far from your body.  Use handles or lifting straps if they are available.  Bend at your knees. Squat down, but keep your heels off the floor.  Keep your shoulders pulled back, your chin tucked in, and your back straight.  Lift the object slowly, tightening the muscles in your legs, abdomen, and buttocks. Keep the object as close to the center of your body as possible.  When you put a load down, use these same guidelines in reverse.  Do not:  Lift the  object above your waist.  Twist at the waist while lifting or carrying a load. Move your feet if you need to turn, not your waist.  Bend over without bending at your knees.  Avoid reaching over your head, across a table, or for an object on a high surface. OTHER TIPS  Avoid wet floors and keep sidewalks clear of ice to prevent falls.  Do not sleep on a mattress that is too soft or too hard.  Keep items that are used frequently within easy reach.  Put heavier objects on shelves at waist level and lighter objects on lower or higher shelves.  Find ways to decrease your stress, such as exercise, massage, or relaxation techniques. Stress can build up in your muscles. Tense muscles are more vulnerable to injury.  Seek treatment for depression or anxiety if needed. These conditions can increase your risk of developing back pain. SEEK MEDICAL CARE IF:  You injure your back.  You have questions about diet, exercise, or other ways to prevent back injuries. MAKE SURE YOU:  Understand these instructions.  Will watch your condition.  Will get help right away if you are not doing well or get worse. Document Released: 01/06/2005 Document Revised: 02/21/2012 Document Reviewed: 01/10/2012 [facility] Patient Information 2015 [place], [place]. This information is not intended to replace advice given to you by your health care provider. Make sure you discuss any questions you have with your health care provider.

## 2014-12-17 NOTE — ED Provider Notes (Signed)
CSN: [id number]     Arrival date & time 12/17/14  [id number] History   First MD Initiated Contact with Patient 12/17/14 1003     Chief Complaint  Patient presents with  . Hip Pain     (Consider location/radiation/quality/duration/timing/severity/associated sxs/prior Treatment) HPI  Cristian Mercado is a 24 y.o. male with PMH of hip pain since 2010 presenting with bilateral hip as well as but optic pain medicine described as a soreness. It is worse with ambulation and rising from a seated position. Patient has been evaluated for this pain before with pelvic x-rays and lumbar spine films which were positive for sacroiliitis. He has had improvement with Toradol injections but no improvement with Percocet. Patient states the pain is exactly like prior episodes with acute worsening in the last 3 days. 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 Patient denies numbness, tingling, weakness. He does endorse intermittent bilateral swelling but denies it today. No fevers, chills, night sweats, weight loss, IVDU, history of malignancy. No loss of control of bladder or bowel. No numbness/tingling, weakness or saddle anesthesia.     History reviewed. No pertinent past medical history. Past Surgical History  Procedure Laterality Date  . Wisdom tooth extraction     History reviewed. No pertinent family history. History  Substance Use Topics  . Smoking status: Never Smoker   . Smokeless tobacco: Never Used  . Alcohol Use: No    Review of Systems  Constitutional: Negative for fever and chills.  HENT: Negative for congestion and rhinorrhea.   Cardiovascular: Negative for chest pain and palpitations.  Gastrointestinal: Negative for nausea, vomiting and diarrhea.  Genitourinary: Negative for dysuria and hematuria.  Musculoskeletal: Positive for myalgias and back pain. Negative for gait problem.  Skin: Negative for rash.  Neurological: Negative for weakness and numbness.       Allergies  Review of patient's allergies indicates no known allergies.  Home Medications   Prior to Admission medications   Medication Sig Start Date End Date Taking? Authorizing Provider  ibuprofen (ADVIL,MOTRIN) 600 MG tablet Take 1 tablet (600 mg total) by mouth every 6 (six) hours as needed. 12/05/14  Yes Arman FilterGail K Schulz, NP  ondansetron (ZOFRAN-ODT) 4 MG disintegrating tablet Take 1 tablet (4 mg total) by mouth every 8 (eight) hours as needed for nausea or vomiting. Patient not taking: Reported on 12/17/2014 12/05/14   Arman FilterGail K Schulz, NP  oxyCODONE-acetaminophen (PERCOCET/ROXICET) 5-325 MG per tablet Take 1 tablet by mouth every 4 (four) hours as needed. Patient not taking: Reported on 12/17/2014 10/12/14   Garlon HatchetLisa M Sanders, PA-C  predniSONE (DELTASONE) 20 MG tablet Take 2 tablets (40 mg total) by mouth daily. Take 40 mg by mouth daily for 3 days, then 20mg  by mouth daily for 3 days, then 10mg  daily for 3 days Patient not taking: Reported on 12/17/2014 10/12/14   Garlon HatchetLisa M Sanders, PA-C  traMADol (ULTRAM) 50 MG tablet Take 1 tablet (50 mg total) by mouth every 6 (six) hours as needed. Patient not taking: Reported on 12/17/2014 10/23/14   Junius FinnerErin O'Malley, PA-C   BP 130/79 mmHg  Pulse 66  Temp(Src) 98.4 F (36.9 C) (Oral)  Resp 16  Ht 5\' 6"  (1.676 m)  Wt 198 lb (89.812 kg)  BMI 31.97 kg/m2  SpO2 100% Physical Exam  Constitutional: He appears well-developed and well-nourished. No distress.  HENT:  Head: Normocephalic and atraumatic.  Eyes: Conjunctivae are normal. Right eye exhibits no discharge. Left eye exhibits no discharge.  Cardiovascular: Normal  rate, regular rhythm and normal heart sounds.   Pulmonary/Chest: Effort normal and breath sounds normal. No respiratory distress. He has no wheezes.  Abdominal: Soft. Bowel sounds are normal. He exhibits no distension. There is no tenderness.  Musculoskeletal:  No midline back tenderness, step off or crepitus. Mild Right sided lower back  tenderness. No CVA tenderness. Right-sided proximal lateral tenderness of right thigh as well as sciatic notch tenderness. This is worse with flexion and extension but not adduction or abduction. Full range of motion. Patient with left-sided sciatic notch tenderness as well. Left-sided for range of motion. No hip tenderness bilaterally.   Neurological: He is alert. Coordination normal.  Equal muscle tone. 5/5 strength in lower extremities. DTR equal and intact.  Negative straight leg test. Normal gait.   Skin: Skin is warm and dry. He is not diaphoretic.  Nursing note and vitals reviewed.   ED Course  Procedures (including critical care time) Labs Review Labs Reviewed - No data to display  Imaging Review No results found.   EKG Interpretation None      MDM   Final diagnoses:  Right low back pain, with sciatica presence unspecified  Sciatic notch pain, unspecified laterality   Patients back in hip pain chronic in nature. No new injury. I do not think new x-rays will provide any new information therefore will defer at present. He has no neurological deficits or deformity on exam. No fevers, chills, swelling, erythema or warmth. I doubt septic arthritis. Patient also with back pain. No red flags and I doubt cauda equina. Due to the persistent nature patient is to follow-up with orthopedics. He states his insurance will come into effect in March. He has  spoken with case management and recommended to go to the wellness Center where he can is a financial program or self-pay for injections of toradol for pain. Pain managed in ED. Pt to treat with ibuprofen. Due to chronic nature will not prescribe narcotics.  Discussed return precautions with patient. Discussed all results and patient verbalizes understanding and agrees with plan.      Louann Sjogren, PA-C 12/17/14 1142  Glynn Octave, MD 12/17/14 [phone]

## 2014-12-17 NOTE — ED Notes (Signed)
Pt reports has inflammation problem in leg and has bilateral swelling and tenderness. Has been seen here before for it. Hip and thigh pain.

## 2014-12-23 ENCOUNTER — Emergency Department (HOSPITAL_COMMUNITY)
Admission: EM | Admit: 2014-12-23 | Discharge: 2014-12-23 | Disposition: A | Discharge status: Home / Self Care | Payer: Self-pay | Attending: Emergency Medicine | Admitting: Emergency Medicine

## 2014-12-23 ENCOUNTER — Encounter (HOSPITAL_COMMUNITY): Payer: Self-pay | Admitting: Family Medicine

## 2014-12-23 DIAGNOSIS — M533 Sacrococcygeal disorders, not elsewhere classified: Secondary | ICD-10-CM | POA: Insufficient documentation

## 2014-12-23 DIAGNOSIS — G8929 Other chronic pain: Secondary | ICD-10-CM | POA: Insufficient documentation

## 2014-12-23 DIAGNOSIS — M25551 Pain in right hip: Secondary | ICD-10-CM | POA: Insufficient documentation

## 2014-12-23 MED ORDER — NAPROXEN 500 MG PO TABS
500.0000 mg | ORAL_TABLET | Freq: Two times a day (BID) | ORAL | Status: DC | PRN
Start: 2014-12-23 — End: 2015-03-14

## 2014-12-23 MED ORDER — CYCLOBENZAPRINE HCL 10 MG PO TABS: 10.0000 mg | ORAL_TABLET | Freq: Three times a day (TID) | ORAL | Status: DC | PRN

## 2014-12-23 MED ORDER — KETOROLAC TROMETHAMINE 30 MG/ML IJ SOLN
60.0000 mg | Freq: Once | INTRAMUSCULAR | Status: AC
  Administered 2014-12-23: 60 mg via INTRAMUSCULAR
  Filled 2014-12-23: qty 2

## 2014-12-23 MED ORDER — OXYCODONE-ACETAMINOPHEN 5-325 MG PO TABS: 1.0000 | ORAL_TABLET | Freq: Four times a day (QID) | ORAL | Status: DC | PRN

## 2014-12-23 NOTE — ED Provider Notes (Signed)
CSN: [id number]     Arrival date & time 12/23/14  1126 History  This chart was scribed for non-physician practitioner, Allen Derry, PA-C working with Nelia Shi, MD by Freida Busman, ED Scribe. This patient was seen in room TR10C/TR10C and the patient's care was started at 12:11 PM.    Chief Complaint  Patient presents with  . Leg Pain    Patient is a 24 y.o. male presenting with leg pain. The history is provided by the patient. No language interpreter was used.  Leg Pain Location:  Buttock Time since incident:  2 days (chronic but worse in last 2 days) Injury: no   Buttock location:  R buttock Pain details:    Quality:  Sharp   Radiates to:  Does not radiate   Severity:  Severe   Onset quality:  Gradual   Duration:  2 days   Timing:  Intermittent   Progression:  Waxing and waning Chronicity:  Recurrent Dislocation: no   Foreign body present:  No foreign bodies Prior injury to area:  Yes (prior sports injury) Relieved by: toradol injections. Worsened by:  Activity and bearing weight Ineffective treatments:  NSAIDs Associated symptoms: no back pain, no decreased ROM, no fever, no muscle weakness, no neck pain, no numbness, no stiffness, no swelling and no tingling      HPI Comments:  Cristian Mercado is a 24 y.o. male with a PMHx of chronic sacroiliitis, who presents to the Emergency Department complaining of "severe" 8/10 constant sharp pain to his right upper leg/buttocks area which is chronic but has worsened over the last 2 days. He states pain is "directly under his butt". Pt has a h/o the same for years secondary to sports injury; past CTs have shown sacroiliiitis. He has been evaluated in the past for the same, states he is usually given a toradol shot which provides relief for long periods of time. For today's pain he has tried aleve without relief. Pt notes he is often on his feet for long periods of time with heavy lifting at work, which exacerbates his pain.  He denies urinary and bowel incontinence, cauda equina symptoms, fever, chills, hip erythema/warmth, hip swelling, CP, SOB, abd pain, n/v/d/c, hematuria, dysuria, numbness, tingling, or weakness. He also denies h/o IV drug use and CA.  History reviewed. No pertinent past medical history. Past Surgical History  Procedure Laterality Date  . Wisdom tooth extraction     History reviewed. No pertinent family history. History  Substance Use Topics  . Smoking status: Never Smoker   . Smokeless tobacco: Never Used  . Alcohol Use: No    Review of Systems  Constitutional: Negative for fever and chills.  Eyes: Negative for pain and redness.  Respiratory: Negative for shortness of breath.   Cardiovascular: Negative for chest pain.  Gastrointestinal: Negative for nausea, vomiting and abdominal pain.  Genitourinary: Negative for dysuria, frequency, hematuria, flank pain, discharge and penile pain.  Musculoskeletal: Positive for myalgias and arthralgias. Negative for back pain, joint swelling, gait problem, stiffness and neck pain.  Skin: Negative for color change.  Allergic/Immunologic: Negative for immunocompromised state.  Neurological: Negative for weakness and numbness.    10 Systems reviewed and all are negative for acute change except as noted in the HPI.    Allergies  Review of patient's allergies indicates no known allergies.  Home Medications   Prior to Admission medications   Medication Sig Start Date End Date Taking? Authorizing Provider  ibuprofen (ADVIL,MOTRIN) 600 MG  tablet Take 1 tablet (600 mg total) by mouth every 6 (six) hours as needed. 12/05/14   Arman FilterGail K Schulz, NP  ondansetron (ZOFRAN-ODT) 4 MG disintegrating tablet Take 1 tablet (4 mg total) by mouth every 8 (eight) hours as needed for nausea or vomiting. Patient not taking: Reported on 12/17/2014 12/05/14   Arman FilterGail K Schulz, NP  oxyCODONE-acetaminophen (PERCOCET/ROXICET) 5-325 MG per tablet Take 1 tablet by mouth every 4  (four) hours as needed. Patient not taking: Reported on 12/17/2014 10/12/14   Garlon HatchetLisa M Sanders, PA-C  predniSONE (DELTASONE) 20 MG tablet Take 2 tablets (40 mg total) by mouth daily. Take 40 mg by mouth daily for 3 days, then 20mg  by mouth daily for 3 days, then 10mg  daily for 3 days Patient not taking: Reported on 12/17/2014 10/12/14   Garlon HatchetLisa M Sanders, PA-C  traMADol (ULTRAM) 50 MG tablet Take 1 tablet (50 mg total) by mouth every 6 (six) hours as needed. Patient not taking: Reported on 12/17/2014 10/23/14   Junius FinnerErin O'Malley, PA-C   BP 131/86 mmHg  Pulse 80  Temp(Src) 98.3 F (36.8 C)  Resp 18  SpO2 98% Physical Exam  Constitutional: He is oriented to person, place, and time. Vital signs are normal. He appears well-developed and well-nourished.  Non-toxic appearance. No distress.  Afebrile, nontoxic  HENT:  Head: Normocephalic and atraumatic.  Mouth/Throat: Oropharynx is clear and moist and mucous membranes are normal.  Eyes: Conjunctivae and EOM are normal. Right eye exhibits no discharge. Left eye exhibits no discharge.  No conjunctivitis  Neck: Normal range of motion. Neck supple.  Cardiovascular: Normal rate and intact distal pulses.   Distal pulses intact  Pulmonary/Chest: Effort normal. No respiratory distress.  Abdominal: Normal appearance. He exhibits no distension.  Musculoskeletal: Normal range of motion.       Right hip: He exhibits tenderness. He exhibits normal range of motion, normal strength, no bony tenderness, no swelling and no crepitus.       Legs: R hip with TTP at ishial tuberosity, mild SI joint TTP with some spasm. No midline spinal TTP or crepitus, no deformity or step offs. No erythema or warmth to the area. FROM intact at lumbar spine and R hip. Ambulatory without difficulty. Strength 5/5 in all extremities, sensation grossly intact in all extremities, distal pulses intact. Neg SLR bilaterally.  Neurological: He is alert and oriented to person, place, and time. He has  normal strength. No sensory deficit. Gait normal.  Skin: Skin is warm, dry and intact. No erythema.  Psychiatric: He has a normal mood and affect.  Nursing note and vitals reviewed.   ED Course  Procedures   DIAGNOSTIC STUDIES:  Oxygen Saturation is 98% on RA, normal by my interpretation.    COORDINATION OF CARE:  12:18 PM Discussed treatment plan with pt at bedside and pt agreed to plan.  Labs Review Labs Reviewed - No data to display  Imaging Review No results found.   EKG Interpretation None      MDM   Final diagnoses:  Chronic right hip pain  Sacroiliac joint disease    24 y.o. male with chronic hip pain. Prior ED visits for same, xray revealed sacroilitis in the past. Pt states he will f/up with ortho in march when his ins kicks in. Given chronic nature of this pain, doubt further imaging needed. No warmth or erythema to hip, doubt septic joint or gout. Discussed that ankylosing spondylitis could be etiology of his pain but that he'd need to f/up  with ortho. Toradol given here, as he has gotten relief from this before. Discussed use of antiinflammatories and flexeril for pain, and given small amount of narcotic for severe pain. Discussed use of heat. Work note given but urged pt to avoid bed rest. Ortho referral given. I explained the diagnosis and have given explicit precautions to return to the ER including for any other new or worsening symptoms. The patient understands and accepts the medical plan as it's been dictated and I have answered their questions. Discharge instructions concerning home care and prescriptions have been given. The patient is STABLE and is discharged to home in good condition.  I personally performed the services described in this documentation, which was scribed in my presence. The recorded information has been reviewed and is accurate.   BP 131/86 mmHg  Pulse 80  Temp(Src) 98.3 F (36.8 C)  Resp 18  SpO2 98%  Meds ordered this encounter   Medications  . ketorolac (TORADOL) 30 MG/ML injection 60 mg    Sig:   . oxyCODONE-acetaminophen (PERCOCET) 5-325 MG per tablet    Sig: Take 1 tablet by mouth every 6 (six) hours as needed for severe pain.    Dispense:  6 tablet    Refill:  0    Order Specific Question:  Supervising Provider    Answer:  Eber Hong D [[id number]]  . naproxen (NAPROSYN) 500 MG tablet    Sig: Take 1 tablet (500 mg total) by mouth 2 (two) times daily as needed for mild pain, moderate pain or headache (TAKE WITH MEALS.).    Dispense:  20 tablet    Refill:  0    Order Specific Question:  Supervising Provider    Answer:  Eber Hong D [[id number]]  . cyclobenzaprine (FLEXERIL) 10 MG tablet    Sig: Take 1 tablet (10 mg total) by mouth 3 (three) times daily as needed for muscle spasms.    Dispense:  15 tablet    Refill:  0    Order Specific Question:  Supervising Provider    Answer:  Vida Roller [address] Camprubi-Soms, PA-C 12/23/14 1304  Nelia Shi, MD 12/24/14 [phone]

## 2014-12-23 NOTE — Discharge Instructions (Signed)
Follow up with an orthopedist is going to be very important for ongoing evaluation of your chronic hip/back pain. Use the instructions below to help with your pain. Return to the ER for changes or worsening symptoms.  Back/Hip Pain:  Your pain should be treated with medicines such as ibuprofen or aleve and this back pain should get better over the next 2 weeks.  However if you develop severe or worsening pain, low back pain with fever, numbness, weakness or inability to walk or urinate, you should return to the ER immediately.  Please follow up with your doctor this week for a recheck if still having symptoms.  Self - care:  The application of heat can help soothe the pain.  Maintaining your daily activities, including walking, is encourged, as it will help you get better faster than just staying in bed. Perform gentle stretching as discussed. Drink plenty of fluids.  Medications are also useful to help with pain control.  A commonly prescribed medication includes norco.  Do not drive or operate heavy machinery while taking this medication.  Non steroidal anti inflammatory medications including Ibuprofen and naproxen;  These medications help both pain and swelling and are very useful in treating back pain.  They should be taken with food, as they can cause stomach upset, and more seriously, stomach bleeding.    Muscle relaxants:  These medications can help with muscle tightness that is a cause of lower back pain.  Most of these medications can cause drowsiness, and it is not safe to drive or use dangerous machinery while taking them.  SEEK IMMEDIATE MEDICAL ATTENTION IF: New numbness, tingling, weakness, or problem with the use of your arms or legs.  Severe back pain not relieved with medications.  Difficulty with or loss of control of your bowel or bladder control.  Increasing pain in any areas of the body (such as chest or abdominal pain).  Shortness of breath, dizziness or fainting.  Nausea  (feeling sick to your stomach), vomiting, fever, or sweats.   Hip Pain Your hip is the joint between your upper legs and your lower pelvis. The bones, cartilage, tendons, and muscles of your hip joint perform a lot of work each day supporting your body weight and allowing you to move around. Hip pain can range from a minor ache to severe pain in one or both of your hips. Pain may be felt on the inside of the hip joint near the groin, or the outside near the buttocks and upper thigh. You may have swelling or stiffness as well.  HOME CARE INSTRUCTIONS   Take medicines only as directed by your health care provider.  Apply ice to the injured area:  Put ice in a plastic bag.  Place a towel between your skin and the bag.  Leave the ice on for 15-20 minutes at a time, 3-4 times a day.  Keep your leg raised (elevated) when possible to lessen swelling.  Avoid activities that cause pain.  Follow specific exercises as directed by your health care provider.  Sleep with a pillow between your legs on your most comfortable side.  Record how often you have hip pain, the location of the pain, and what it feels like. SEEK MEDICAL CARE IF:   You are unable to put weight on your leg.  Your hip is red or swollen or very tender to touch.  Your pain or swelling continues or worsens after 1 week.  You have increasing difficulty walking.  You have  a fever. SEEK IMMEDIATE MEDICAL CARE IF:   You have fallen.  You have a sudden increase in pain and swelling in your hip. MAKE SURE YOU:   Understand these instructions.  Will watch your condition.  Will get help right away if you are not doing well or get worse. Document Released: 05/19/2010 Document Revised: 04/15/2014 Document Reviewed: 07/26/2013 [facility] Patient Information 2015 [place], [place]. This information is not intended to replace advice given to you by your health care provider. Make sure you discuss any questions you have with your  health care provider.  Sacroiliac Joint Dysfunction The sacroiliac joint connects the lower part of the spine (the sacrum) with the bones of the pelvis. CAUSES  Sometimes, there is no obvious reason for sacroiliac joint dysfunction. Other times, it may occur   During pregnancy.  After injury, such as:  Car accidents.  Sport-related injuries.  Work-related injuries.  Due to one leg being shorter than the other.  Due to other conditions that affect the joints, such as:  Rheumatoid arthritis.  Gout.  Psoriasis.  Joint infection (septic arthritis). SYMPTOMS  Symptoms may include:  Pain in the:  Lower back.  Buttocks.  Groin.  Thighs and legs.  Difficult sitting, standing, walking, lying, bending or lifting. DIAGNOSIS  A number of tests may be used to help diagnose the cause of sacroiliac joint dysfunction, including:  Imaging tests to look for other causes of pain, including:  MRI.  CT scan.  Bone scan.  Diagnostic injection: During a special x-ray (called fluoroscopy), a needle is put into the sacroiliac joint. A numbing medicine is injected into the joint. If the pain is improved or stopped, the diagnosis of sacroiliac joint dysfunction is more likely. TREATMENT  There are a number of types of treatment used for sacroiliac joint dysfunction, including:  Only take over-the-counter or prescription medicines for pain, discomfort, or fever as directed by your caregiver.  Medications to relax muscles.  Rest. Decreasing activity can help cut down on painful muscle spasms and allow the back to heal.  Application of heat or ice to the lower back may improve muscle spasms and soothe pain.  Brace. A special back brace, called a sacroiliac belt, can help support the joint while your back is healing.  Physical therapy can help teach comfortable positions and exercises to strengthen muscles that support the sacroiliac joint.  Cortisone injections. Injections of  steroid medicine into the joint can help decrease swelling and improve pain.  Hyaluronic acid injections. This chemical improves lubrication within the sacroiliac joint, thereby decreasing pain.  Radiofrequency ablation. A special needle is placed into the joint, where it burns away nerves that are carrying pain messages from the joint.  Surgery. Because pain occurs during movement of the joint, screws and plates may be installed in order to limit or prevent joint motion. HOME CARE INSTRUCTIONS   Take all medications exactly as directed.  Follow instructions regarding both rest and physical activity, to avoid worsening the pain.  Do physical therapy exercises exactly as prescribed. SEEK IMMEDIATE MEDICAL CARE IF:  You experience increasingly severe pain.  You develop new symptoms, such as numbness or tingling in your legs or feet.  You lose bladder or bowel control. Document Released: 02/25/2009 Document Revised: 02/21/2012 Document Reviewed: 02/25/2009 [facility] Patient Information 2015 [place], [place]. This information is not intended to replace advice given to you by your health care provider. Make sure you discuss any questions you have with your health care provider.  Heat  Therapy Heat therapy can help ease sore, stiff, injured, and tight muscles and joints. Heat relaxes your muscles, which may help ease your pain.  RISKS AND COMPLICATIONS If you have any of the following conditions, do not use heat therapy unless your health care provider has approved:  Poor circulation.  Healing wounds or scarred skin in the area being treated.  Diabetes, heart disease, or high blood pressure.  Not being able to feel (numbness) the area being treated.  Unusual swelling of the area being treated.  Active infections.  Blood clots.  Cancer.  Inability to communicate pain. This may include young children and people who have problems with their brain function  (dementia).  Pregnancy. Heat therapy should only be used on old, pre-existing, or long-lasting (chronic) injuries. Do not use heat therapy on new injuries unless directed by your health care provider. HOW TO USE HEAT THERAPY There are several different kinds of heat therapy, including:  Moist heat pack.  Warm water bath.  Hot water bottle.  Electric heating pad.  Heated gel pack.  Heated wrap.  Electric heating pad. Use the heat therapy method suggested by your health care provider. Follow your health care provider's instructions on when and how to use heat therapy. GENERAL HEAT THERAPY RECOMMENDATIONS  Do not sleep while using heat therapy. Only use heat therapy while you are awake.  Your skin may turn pink while using heat therapy. Do not use heat therapy if your skin turns red.  Do not use heat therapy if you have new pain.  High heat or long exposure to heat can cause burns. Be careful when using heat therapy to avoid burning your skin.  Do not use heat therapy on areas of your skin that are already irritated, such as with a rash or sunburn. SEEK MEDICAL CARE IF:  You have blisters, redness, swelling, or numbness.  You have new pain.  Your pain is worse. MAKE SURE YOU:  Understand these instructions.  Will watch your condition.  Will get help right away if you are not doing well or get worse. Document Released: 02/21/2012 Document Revised: 04/15/2014 Document Reviewed: 01/22/2014 [facility] Patient Information 2015 [place], [place]. This information is not intended to replace advice given to you by your health care provider. Make sure you discuss any questions you have with your health care provider.

## 2014-12-23 NOTE — ED Notes (Signed)
Pt here with chronic inflammation to right leg and buttocks area.

## 2014-12-23 NOTE — ED Notes (Signed)
Pt sobbing loudly.

## 2014-12-25 ENCOUNTER — Encounter (HOSPITAL_COMMUNITY): Payer: Self-pay | Admitting: *Deleted

## 2014-12-25 ENCOUNTER — Emergency Department (INDEPENDENT_AMBULATORY_CARE_PROVIDER_SITE_OTHER)
Admission: EM | Admit: 2014-12-25 | Discharge: 2014-12-25 | Disposition: A | Discharge status: Home / Self Care | Payer: Self-pay | Source: Home / Self Care | Attending: Emergency Medicine | Admitting: Emergency Medicine

## 2014-12-25 DIAGNOSIS — M461 Sacroiliitis, not elsewhere classified: Secondary | ICD-10-CM

## 2014-12-25 MED ORDER — IBUPROFEN 800 MG PO TABS: 800.0000 mg | ORAL_TABLET | Freq: Three times a day (TID) | ORAL | Status: DC

## 2014-12-25 MED ORDER — PREDNISONE 20 MG PO TABS
60.0000 mg | ORAL_TABLET | Freq: Once | ORAL | Status: AC
  Administered 2014-12-25: 60 mg via ORAL

## 2014-12-25 MED ORDER — PREDNISONE 10 MG PO TABS
ORAL_TABLET | ORAL | Status: DC
Start: 2014-12-25 — End: 2016-07-29

## 2014-12-25 MED ORDER — KETOROLAC TROMETHAMINE 30 MG/ML IJ SOLN
60.0000 mg | Freq: Once | INTRAMUSCULAR | Status: AC
  Administered 2014-12-25: 60 mg via INTRAMUSCULAR

## 2014-12-25 MED ORDER — PREDNISONE 20 MG PO TABS: 40.0000 mg | ORAL_TABLET | Freq: Every day | ORAL | Status: DC

## 2014-12-25 MED ORDER — KETOROLAC TROMETHAMINE 60 MG/2ML IM SOLN
INTRAMUSCULAR | Status: AC
  Filled 2014-12-25: qty 2

## 2014-12-25 MED ORDER — PREDNISONE 20 MG PO TABS
ORAL_TABLET | ORAL | Status: AC
  Filled 2014-12-25: qty 3

## 2014-12-25 NOTE — ED Notes (Addendum)
C/o R sacroilliac joint pain onset last July.  No known recent injury.  No radiation of pain.  Has had pain on the L yesterday but it went away.  He was  Been seen in ED 4 days ago and got Toradol injection, Oxycodone, Naprosyn, and Flexeril without relief.  [facility]

## 2014-12-25 NOTE — ED Provider Notes (Signed)
CSN: [id number]     Arrival date & time 12/25/14  1743 History   First MD Initiated Contact with Patient 12/25/14 1829     Chief Complaint  Patient presents with  . Back Pain   (Consider location/radiation/quality/duration/timing/severity/associated sxs/prior Treatment) HPI        24 year old male with chronic bilateral sacroiliitis presents complaining of right take really a joint pain. He has been seen for this problem multiple times of the past few days in the emergency department, and as far back as 2010. He says he is not sure why but the pain is much worse recently. He feels the pain at the base of his right buttock. No numbness or weakness. No recent injury. No loss of bowel or bladder control. No history of IV drug use. He has been given Toradol shots in the past which is usually helpful. He has been taking Naprosyn as well. He was given a prescription for narcotics and Flexeril but he is not taking either of those.   History reviewed. No pertinent past medical history. Past Surgical History  Procedure Laterality Date  . Wisdom tooth extraction     History reviewed. No pertinent family history. History  Substance Use Topics  . Smoking status: Never Smoker   . Smokeless tobacco: Never Used  . Alcohol Use: No    Review of Systems  Musculoskeletal: Positive for back pain and arthralgias (hip pain).    Allergies  Review of patient's allergies indicates no known allergies.  Home Medications   Prior to Admission medications   Medication Sig Start Date End Date Taking? Authorizing Provider  naproxen (NAPROSYN) 500 MG tablet Take 1 tablet (500 mg total) by mouth 2 (two) times daily as needed for mild pain, moderate pain or headache (TAKE WITH MEALS.). 12/23/14  Yes Mercedes Strupp Camprubi-Soms, PA-C  oxyCODONE-acetaminophen (PERCOCET) 5-325 MG per tablet Take 1 tablet by mouth every 6 (six) hours as needed for severe pain. 12/23/14  Yes Mercedes Strupp Camprubi-Soms, PA-C   oxyCODONE-acetaminophen (PERCOCET/ROXICET) 5-325 MG per tablet Take 1 tablet by mouth every 4 (four) hours as needed. 10/12/14  Yes Garlon HatchetLisa M Sanders, PA-C  cyclobenzaprine (FLEXERIL) 10 MG tablet Take 1 tablet (10 mg total) by mouth 3 (three) times daily as needed for muscle spasms. 12/23/14   Mercedes Strupp Camprubi-Soms, PA-C  ibuprofen (ADVIL,MOTRIN) 600 MG tablet Take 1 tablet (600 mg total) by mouth every 6 (six) hours as needed. 12/05/14   Arman FilterGail K Schulz, NP  ibuprofen (ADVIL,MOTRIN) 800 MG tablet Take 1 tablet (800 mg total) by mouth 3 (three) times daily. 12/25/14   Adrian BlackwaterZachary H Aniesa Boback, PA-C  ondansetron (ZOFRAN-ODT) 4 MG disintegrating tablet Take 1 tablet (4 mg total) by mouth every 8 (eight) hours as needed for nausea or vomiting. Patient not taking: Reported on 12/17/2014 12/05/14   Arman FilterGail K Schulz, NP  predniSONE (DELTASONE) 10 MG tablet 4 tabs PO QD for 4 days; 3 tabs PO QD for 3 days; 2 tabs PO QD for 2 days; 1 tab PO QD for 1 day 12/25/14   Graylon GoodZachary H Bailen Geffre, PA-C  predniSONE (DELTASONE) 20 MG tablet Take 2 tablets (40 mg total) by mouth daily. Take 40 mg by mouth daily for 3 days, then 20mg  by mouth daily for 3 days, then 10mg  daily for 3 days 12/25/14   Graylon GoodZachary H Laretta Pyatt, PA-C  traMADol (ULTRAM) 50 MG tablet Take 1 tablet (50 mg total) by mouth every 6 (six) hours as needed. Patient not taking: Reported on 12/17/2014 10/23/14  Junius Finner, PA-C   BP 125/80 mmHg  Pulse 66  Temp(Src) 98.2 F (36.8 C) (Oral)  Resp 18  SpO2 96% Physical Exam  Constitutional: He is oriented to person, place, and time. He appears well-developed and well-nourished. No distress.  HENT:  Head: Normocephalic.  Pulmonary/Chest: Effort normal. No respiratory distress.  Musculoskeletal:       Right hip: He exhibits tenderness (Tenderness in the area of the right sacroiliac joint).  Neurological: He is alert and oriented to person, place, and time. He has normal strength. No sensory deficit. Coordination and gait  normal.  Skin: Skin is warm and dry. No rash noted. He is not diaphoretic.  Psychiatric: He has a normal mood and affect. Judgment normal.  Nursing note and vitals reviewed.   ED Course  Procedures (including critical care time) Labs Review Labs Reviewed - No data to display  Imaging Review No results found.   MDM   1. Bilateral sacroiliitis    Given Toradol again today and also prescription for prednisone. He was given a prescription for 800 mg ibuprofen to start taking after the prednisone is done. He'll follow-up with [facility]   Meds ordered this encounter  Medications  . ketorolac (TORADOL) 30 MG/ML injection 60 mg    Sig:   . predniSONE (DELTASONE) tablet 60 mg    Sig:   . predniSONE (DELTASONE) 20 MG tablet    Sig: Take 2 tablets (40 mg total) by mouth daily. Take 40 mg by mouth daily for 3 days, then  by mouth daily for 3 days, then  daily for 3 days    Dispense:  12 tablet    Refill:  0    Order Specific Question:  Supervising Provider    Answer:  Lorenz Coaster, DAVID C [id number]  . predniSONE (DELTASONE) 10 MG tablet    Sig: 4 tabs PO QD for 4 days; 3 tabs PO QD for 3 days; 2 tabs PO QD for 2 days; 1 tab PO QD for 1 day    Dispense:  30 tablet    Refill:  0    Order Specific Question:  Supervising Provider    Answer:  Lorenz Coaster, DAVID C [id number]  . ibuprofen (ADVIL,MOTRIN) 800 MG tablet    Sig: Take 1 tablet (800 mg total) by mouth 3 (three) times daily.    Dispense:  60 tablet    Refill:  1    Order Specific Question:  Supervising Provider    Answer:  Lorenz Coaster, DAVID C [[id number]]       Graylon Good, PA-C 12/25/14 1839

## 2015-03-12 ENCOUNTER — Emergency Department (HOSPITAL_COMMUNITY)
Admission: EM | Admit: 2015-03-12 | Discharge: 2015-03-12 | Disposition: A | Discharge status: Home / Self Care | Payer: Self-pay | Attending: Emergency Medicine | Admitting: Emergency Medicine

## 2015-03-12 ENCOUNTER — Encounter (HOSPITAL_COMMUNITY): Payer: Self-pay | Admitting: *Deleted

## 2015-03-12 DIAGNOSIS — Z7952 Long term (current) use of systemic steroids: Secondary | ICD-10-CM | POA: Insufficient documentation

## 2015-03-12 DIAGNOSIS — Y9301 Activity, walking, marching and hiking: Secondary | ICD-10-CM | POA: Insufficient documentation

## 2015-03-12 DIAGNOSIS — G8929 Other chronic pain: Secondary | ICD-10-CM | POA: Insufficient documentation

## 2015-03-12 DIAGNOSIS — Z791 Long term (current) use of non-steroidal anti-inflammatories (NSAID): Secondary | ICD-10-CM | POA: Insufficient documentation

## 2015-03-12 DIAGNOSIS — M7071 Other bursitis of hip, right hip: Secondary | ICD-10-CM | POA: Insufficient documentation

## 2015-03-12 MED ORDER — MELOXICAM 15 MG PO TABS: 15.0000 mg | ORAL_TABLET | Freq: Every day | ORAL | Status: DC

## 2015-03-12 MED ORDER — KETOROLAC TROMETHAMINE 60 MG/2ML IM SOLN
60.0000 mg | Freq: Once | INTRAMUSCULAR | Status: AC
  Administered 2015-03-12: 60 mg via INTRAMUSCULAR
  Filled 2015-03-12: qty 2

## 2015-03-12 NOTE — ED Notes (Signed)
Pt reports having right hip pain for several days, hx of same and denies new injury. Ambulatory at triage.

## 2015-03-12 NOTE — Discharge Instructions (Signed)
Take Mobic as needed for pain. Refer to attached documents for more information. Follow up with Dr. Lajoyce Cornersuda for further evaluation and management of your pain.

## 2015-03-12 NOTE — ED Provider Notes (Signed)
CSN: [id number]     Arrival date & time 03/12/15  0839 History  This chart was scribed for non-physician practitioner, Scherry RanKaityln Jamera Vanloan, working with Blane OharaJoshua Zavitz, MD by Richarda Overlieichard Holland, ED Scribe. This patient was seen in room TR06C/TR06C and the patient's care was started at 9:16 AM.      Chief Complaint  Patient presents with  . Hip Pain   The history is provided by the patient. No language interpreter was used.   HPI Comments: Margrett RudRoyanta T Corkins is a 24 y.o. male who presents to the Emergency Department complaining of chronic right hip pain that worsened over the last several days. Pt states he has recurrent right hip problems from a sports injury years ago. He denies any new injury. He states that walking worsens his pain and describes it as constant and sharp. Pt states that oxycodone and ibuprofen have failed to provide him pain relief. Pt reports no alleviating factors at this time. He denies any numbness or weakness in his right leg.    History reviewed. No pertinent past medical history. Past Surgical History  Procedure Laterality Date  . Wisdom tooth extraction     History reviewed. No pertinent family history. History  Substance Use Topics  . Smoking status: Never Smoker   . Smokeless tobacco: Never Used  . Alcohol Use: No    Review of Systems  Musculoskeletal: Positive for arthralgias.  Neurological: Negative for weakness and numbness.  All other systems reviewed and are negative.     Allergies  Review of patient's allergies indicates no known allergies.  Home Medications   Prior to Admission medications   Medication Sig Start Date End Date Taking? Authorizing Provider  cyclobenzaprine (FLEXERIL) 10 MG tablet Take 1 tablet (10 mg total) by mouth 3 (three) times daily as needed for muscle spasms. 12/23/14   Mercedes Camprubi-Soms, PA-C  ibuprofen (ADVIL,MOTRIN) 600 MG tablet Take 1 tablet (600 mg total) by mouth every 6 (six) hours as needed. 12/05/14   Earley FavorGail Schulz,  NP  ibuprofen (ADVIL,MOTRIN) 800 MG tablet Take 1 tablet (800 mg total) by mouth 3 (three) times daily. 12/25/14   Adrian BlackwaterZachary H Baker, PA-C  naproxen (NAPROSYN) 500 MG tablet Take 1 tablet (500 mg total) by mouth 2 (two) times daily as needed for mild pain, moderate pain or headache (TAKE WITH MEALS.). 12/23/14   Mercedes Camprubi-Soms, PA-C  ondansetron (ZOFRAN-ODT) 4 MG disintegrating tablet Take 1 tablet (4 mg total) by mouth every 8 (eight) hours as needed for nausea or vomiting. Patient not taking: Reported on 12/17/2014 12/05/14   Earley FavorGail Schulz, NP  oxyCODONE-acetaminophen (PERCOCET) 5-325 MG per tablet Take 1 tablet by mouth every 6 (six) hours as needed for severe pain. 12/23/14   Mercedes Camprubi-Soms, PA-C  oxyCODONE-acetaminophen (PERCOCET/ROXICET) 5-325 MG per tablet Take 1 tablet by mouth every 4 (four) hours as needed. 10/12/14   Garlon HatchetLisa M Sanders, PA-C  predniSONE (DELTASONE) 10 MG tablet 4 tabs PO QD for 4 days; 3 tabs PO QD for 3 days; 2 tabs PO QD for 2 days; 1 tab PO QD for 1 day 12/25/14   Graylon GoodZachary H Baker, PA-C  predniSONE (DELTASONE) 20 MG tablet Take 2 tablets (40 mg total) by mouth daily. Take 40 mg by mouth daily for 3 days, then 20mg  by mouth daily for 3 days, then 10mg  daily for 3 days 12/25/14   Graylon GoodZachary H Baker, PA-C  traMADol (ULTRAM) 50 MG tablet Take 1 tablet (50 mg total) by mouth every 6 (six) hours as  needed. Patient not taking: Reported on 12/17/2014 10/23/14   Junius Finner, PA-C   BP 123/69 mmHg  Pulse 71  Temp(Src) 98.1 F (36.7 C) (Oral)  Resp 16  Ht  (1.676 m)  Wt 192 lb (87.091 kg)  BMI 31.00 kg/m2  SpO2 100% Physical Exam  Constitutional: He is oriented to person, place, and time. He appears well-developed and well-nourished.  HENT:  Head: Normocephalic and atraumatic.  Eyes: Right eye exhibits no discharge. Left eye exhibits no discharge.  Neck: Neck supple. No tracheal deviation present.  Cardiovascular: Normal rate.   Pulmonary/Chest: Effort normal. No  respiratory distress. He has no wheezes. He has no rales.  Abdominal: Soft. He exhibits no distension.  Musculoskeletal: Normal range of motion.  Lateral right hip tenderness to palpation. No obvious joint deformity. Slightly limited ROM of right hip due to pain.   Neurological: He is alert and oriented to person, place, and time. Coordination normal.  Skin: Skin is warm and dry.  Psychiatric: He has a normal mood and affect. His behavior is normal.  Nursing note and vitals reviewed.   ED Course  Procedures  DIAGNOSTIC STUDIES: Oxygen Saturation is 100% on RA, normal by my interpretation.    COORDINATION OF CARE: 9:16 AM Discussed treatment plan with pt at bedside and pt agreed to plan.   Labs Review Labs Reviewed - No data to display  Imaging Review No results found.   EKG Interpretation None      MDM   Final diagnoses:  Hip bursitis, right    9:21 AM Patient like has right hip bursitis and will have IM toradol for pain. No neurovascular compromise. Patient will have Ortho referral. Vitals stable and patient afebrile.   I personally performed the services described in this documentation, which was scribed in my presence. The recorded information has been reviewed and is accurate.      [address] [place], PA-C 03/12/15 4098  Blane Ohara, MD 03/12/15 [phone]

## 2015-03-12 NOTE — ED Notes (Signed)
Declined W/C at D/C and was escorted to lobby by RN. 

## 2015-03-14 ENCOUNTER — Emergency Department (HOSPITAL_COMMUNITY): Payer: Self-pay

## 2015-03-14 ENCOUNTER — Encounter (HOSPITAL_COMMUNITY): Payer: Self-pay

## 2015-03-14 ENCOUNTER — Emergency Department (HOSPITAL_COMMUNITY)
Admission: EM | Admit: 2015-03-14 | Discharge: 2015-03-14 | Disposition: A | Discharge status: Home / Self Care | Payer: Self-pay | Attending: Emergency Medicine | Admitting: Emergency Medicine

## 2015-03-14 DIAGNOSIS — M461 Sacroiliitis, not elsewhere classified: Secondary | ICD-10-CM | POA: Insufficient documentation

## 2015-03-14 DIAGNOSIS — G8929 Other chronic pain: Secondary | ICD-10-CM | POA: Insufficient documentation

## 2015-03-14 DIAGNOSIS — Z791 Long term (current) use of non-steroidal anti-inflammatories (NSAID): Secondary | ICD-10-CM | POA: Insufficient documentation

## 2015-03-14 DIAGNOSIS — M25551 Pain in right hip: Secondary | ICD-10-CM | POA: Insufficient documentation

## 2015-03-14 DIAGNOSIS — R52 Pain, unspecified: Secondary | ICD-10-CM

## 2015-03-14 DIAGNOSIS — Z79899 Other long term (current) drug therapy: Secondary | ICD-10-CM | POA: Insufficient documentation

## 2015-03-14 MED ORDER — KETOROLAC TROMETHAMINE 60 MG/2ML IM SOLN
60.0000 mg | Freq: Once | INTRAMUSCULAR | Status: AC
  Administered 2015-03-14: 60 mg via INTRAMUSCULAR
  Filled 2015-03-14: qty 2

## 2015-03-14 MED ORDER — METHOCARBAMOL 750 MG PO TABS: 750.0000 mg | ORAL_TABLET | Freq: Four times a day (QID) | ORAL | Status: DC | PRN

## 2015-03-14 NOTE — ED Notes (Signed)
Pt requested Toradol injection. PA ordered.

## 2015-03-14 NOTE — ED Notes (Signed)
Pt. Reports having rt. Hip pain,   Was here 2 days ago. Unable to fill RX.

## 2015-03-14 NOTE — Discharge Instructions (Signed)
Read the information below.  Use the prescribed medication as directed.  Please discuss all new medications with your pharmacist.  You may return to the Emergency Department at any time for worsening condition or any new symptoms that concern you.    If you develop uncontrolled pain, weakness or numbness of the extremity, severe discoloration of the skin, or you are unable to move your hip, return to the ER for a recheck.      Chronic Pain Chronic pain can be defined as pain that is off and on and lasts for 3-6 months or longer. Many things cause chronic pain, which can make it difficult to make a diagnosis. There are many treatment options available for chronic pain. However, finding a treatment that works well for you may require trying various approaches until the right one is found. Many people benefit from a combination of two or more types of treatment to control their pain. SYMPTOMS  Chronic pain can occur anywhere in the body and can range from mild to very severe. Some types of chronic pain include:  Headache.  Low back pain.  Cancer pain.  Arthritis pain.  Neurogenic pain. This is pain resulting from damage to nerves. People with chronic pain may also have other symptoms such as:  Depression.  Anger.  Insomnia.  Anxiety. DIAGNOSIS  Your health care provider will help diagnose your condition over time. In many cases, the initial focus will be on excluding possible conditions that could be causing the pain. Depending on your symptoms, your health care provider may order tests to diagnose your condition. Some of these tests may include:   Blood tests.   CT scan.   MRI.   X-rays.   Ultrasounds.   Nerve conduction studies.  You may need to see a specialist.  TREATMENT  Finding treatment that works well may take time. You may be referred to a pain specialist. He or she may prescribe medicine or therapies, such as:   Mindful meditation or yoga.  Shots  (injections) of numbing or pain-relieving medicines into the spine or area of pain.  Local electrical stimulation.  Acupuncture.   Massage therapy.   Aroma, color, light, or sound therapy.   Biofeedback.   Working with a physical therapist to keep from getting stiff.   Regular, gentle exercise.   Cognitive or behavioral therapy.   Group support.  Sometimes, surgery may be recommended.  HOME CARE INSTRUCTIONS   Take all medicines as directed by your health care provider.   Lessen stress in your life by relaxing and doing things such as listening to calming music.   Exercise or be active as directed by your health care provider.   Eat a healthy diet and include things such as vegetables, fruits, fish, and lean meats in your diet.   Keep all follow-up appointments with your health care provider.   Attend a support group with others suffering from chronic pain. SEEK MEDICAL CARE IF:   Your pain gets worse.   You develop a new pain that was not there before.   You cannot tolerate medicines given to you by your health care provider.   You have new symptoms since your last visit with your health care provider.  SEEK IMMEDIATE MEDICAL CARE IF:   You feel weak.   You have decreased sensation or numbness.   You lose control of bowel or bladder function.   Your pain suddenly gets much worse.   You develop shaking.  You develop  chills.  You develop confusion.  You develop chest pain.  You develop shortness of breath.  MAKE SURE YOU:  Understand these instructions.  Will watch your condition.  Will get help right away if you are not doing well or get worse. Document Released: 08/21/2002 Document Revised: 08/01/2013 Document Reviewed: 05/25/2013 [facility] Patient Information 2015 [place], [place]. This information is not intended to replace advice given to you by your health care provider. Make sure you discuss any questions you have with  your health care provider.  Hip Pain Your hip is the joint between your upper legs and your lower pelvis. The bones, cartilage, tendons, and muscles of your hip joint perform a lot of work each day supporting your body weight and allowing you to move around. Hip pain can range from a minor ache to severe pain in one or both of your hips. Pain may be felt on the inside of the hip joint near the groin, or the outside near the buttocks and upper thigh. You may have swelling or stiffness as well.  HOME CARE INSTRUCTIONS   Take medicines only as directed by your health care provider.  Apply ice to the injured area:  Put ice in a plastic bag.  Place a towel between your skin and the bag.  Leave the ice on for 15-20 minutes at a time, 3-4 times a day.  Keep your leg raised (elevated) when possible to lessen swelling.  Avoid activities that cause pain.  Follow specific exercises as directed by your health care provider.  Sleep with a pillow between your legs on your most comfortable side.  Record how often you have hip pain, the location of the pain, and what it feels like. SEEK MEDICAL CARE IF:   You are unable to put weight on your leg.  Your hip is red or swollen or very tender to touch.  Your pain or swelling continues or worsens after 1 week.  You have increasing difficulty walking.  You have a fever. SEEK IMMEDIATE MEDICAL CARE IF:   You have fallen.  You have a sudden increase in pain and swelling in your hip. MAKE SURE YOU:   Understand these instructions.  Will watch your condition.  Will get help right away if you are not doing well or get worse. Document Released: 05/19/2010 Document Revised: 04/15/2014 Document Reviewed: 07/26/2013 [facility] Patient Information 2015 [place], [place]. This information is not intended to replace advice given to you by your health care provider. Make sure you discuss any questions you have with your health care provider.

## 2015-03-14 NOTE — ED Provider Notes (Signed)
CSN: [id number]     Arrival date & time 03/14/15  0809 History  This chart was scribed for non-physician practitioner, Trixie Dredge, PA-C, working with Gwyneth Sprout, MD by Charline Bills, ED Scribe. This patient was seen in room TR09C/TR09C and the patient's care was started at 9:40 AM.   Chief Complaint  Patient presents with  . Hip Pain   The history is provided by the patient. No language interpreter was used.   HPI Comments: Cristian Mercado is a 24 y.o. male who presents to the Emergency Department complaining of intermittent R hip pain worsened over the past 2 days. Pt reports initial onset of hip pain 5 years ago when he was injured while wrestling. Has had intermittent pain several times a year since then.  No new injury. He describes pain as a sharp sensation that is exacerbated with bearing weight. He experiences hip pain a few times a year that lasts 2-3 weeks. Pt has noticed warmth to his R hip. He denies weakness, numbness/tingling, fever, chills, bowel or bladder symptoms, abdominal pain, any other arthralgias. Pt was seen in the ED 2 days ago and prescribed Mobic without relief. Pt plans to schedule an appointment with ortho.   No past medical history on file. Past Surgical History  Procedure Laterality Date  . Wisdom tooth extraction     No family history on file. History  Substance Use Topics  . Smoking status: Never Smoker   . Smokeless tobacco: Never Used  . Alcohol Use: No    Review of Systems  Constitutional: Negative for fever and chills.  Cardiovascular: Negative for leg swelling.  Gastrointestinal: Negative.  Negative for abdominal pain.  Genitourinary: Negative.   Musculoskeletal: Positive for arthralgias. Negative for joint swelling.  Skin: Negative for color change.  Allergic/Immunologic: Negative for immunocompromised state.  Neurological: Negative for weakness and numbness.  Hematological: Does not bruise/bleed easily.  Psychiatric/Behavioral: Negative for  self-injury.   Allergies  Review of patient's allergies indicates no known allergies.  Home Medications   Prior to Admission medications   Medication Sig Start Date End Date Taking? Authorizing Provider  cyclobenzaprine (FLEXERIL) 10 MG tablet Take 1 tablet (10 mg total) by mouth 3 (three) times daily as needed for muscle spasms. 12/23/14   Mercedes Camprubi-Soms, PA-C  ibuprofen (ADVIL,MOTRIN) 600 MG tablet Take 1 tablet (600 mg total) by mouth every 6 (six) hours as needed. 12/05/14   Earley Favor, NP  ibuprofen (ADVIL,MOTRIN) 800 MG tablet Take 1 tablet (800 mg total) by mouth 3 (three) times daily. 12/25/14   Graylon Good, PA-C  meloxicam (MOBIC) 15 MG tablet Take 1 tablet (15 mg total) by mouth daily. 03/12/15   Kaitlyn Szekalski, PA-C  naproxen (NAPROSYN) 500 MG tablet Take 1 tablet (500 mg total) by mouth 2 (two) times daily as needed for mild pain, moderate pain or headache (TAKE WITH MEALS.). 12/23/14   Mercedes Camprubi-Soms, PA-C  ondansetron (ZOFRAN-ODT) 4 MG disintegrating tablet Take 1 tablet (4 mg total) by mouth every 8 (eight) hours as needed for nausea or vomiting. Patient not taking: Reported on 12/17/2014 12/05/14   Earley Favor, NP  oxyCODONE-acetaminophen (PERCOCET) 5-325 MG per tablet Take 1 tablet by mouth every 6 (six) hours as needed for severe pain. 12/23/14   Mercedes Camprubi-Soms, PA-C  oxyCODONE-acetaminophen (PERCOCET/ROXICET) 5-325 MG per tablet Take 1 tablet by mouth every 4 (four) hours as needed. 10/12/14   Garlon Hatchet, PA-C  predniSONE (DELTASONE) 10 MG tablet 4 tabs PO QD for  4 days; 3 tabs PO QD for 3 days; 2 tabs PO QD for 2 days; 1 tab PO QD for 1 day 12/25/14   Graylon GoodZachary H Baker, PA-C  predniSONE (DELTASONE) 20 MG tablet Take 2 tablets (40 mg total) by mouth daily. Take 40 mg by mouth daily for 3 days, then 20mg  by mouth daily for 3 days, then 10mg  daily for 3 days 12/25/14   Graylon GoodZachary H Baker, PA-C  traMADol (ULTRAM) 50 MG tablet Take 1 tablet (50 mg total) by  mouth every 6 (six) hours as needed. Patient not taking: Reported on 12/17/2014 10/23/14   Junius FinnerErin O'Malley, PA-C   BP 160/80 mmHg  Pulse 73  Temp(Src) 97.5 F (36.4 C) (Oral)  Resp 18  Ht 5\' 6"  (1.676 m)  Wt 192 lb (87.091 kg)  BMI 31.00 kg/m2  SpO2 98% Physical Exam  Constitutional: He appears well-developed and well-nourished. No distress.  HENT:  Head: Normocephalic and atraumatic.  Neck: Neck supple.  Pulmonary/Chest: Effort normal.  Abdominal: Soft. He exhibits no distension. There is no tenderness. There is no rebound and no guarding.  Musculoskeletal:       Back:  Spine nontender, no crepitus, or stepoffs. Lower extremities: Strength 5/5, sensation intact, distal pulses intact.   Neurological: He is alert.  Skin: He is not diaphoretic.  Nursing note and vitals reviewed.  ED Course  Procedures (including critical care time) DIAGNOSTIC STUDIES: Oxygen Saturation is 98% on RA, normal by my interpretation.    COORDINATION OF CARE: 9:44 AM-Discussed treatment plan which includes XR with pt at bedside and pt agreed to plan.   Labs Review Labs Reviewed - No data to display  Imaging Review Dg Hip Unilat With Pelvis 2-3 Views Right  03/14/2015   CLINICAL DATA:  Chronic right hip pain, progressive.  EXAM: RIGHT HIP (WITH PELVIS) 2-3 VIEWS  COMPARISON:  Radiographs dated 08/02/2014 and 08/27/2009  FINDINGS: Osseous structures of the right hip are normal. No joint effusion or soft tissue calcification.  There is bilateral sacroiliitis, more prominent on the right than the left.  IMPRESSION: No acute abnormality.  Bilateral sacroiliitis.   Electronically Signed   By: Francene BoyersJames  Maxwell M.D.   On: 03/14/2015 10:18     EKG Interpretation None      MDM   Final diagnoses:  Pain  Chronic hip pain, right  Sacroiliitis    Afebrile, nontoxic patient with exacerbation of chronic right hip pain.  No red flags with history or exam.  Neurovascularly intact.  No record of hip film on  EPIC, xray negative with exception of chronic sacroiliitis (present on former film).   D/C home with continued mobic, robaxin, orthopedic follow up.   Discussed result, findings, treatment, and follow up  with patient.  Pt given return precautions.  Pt verbalizes understanding and agrees with plan.        I personally performed the services described in this documentation, which was scribed in my presence. The recorded information has been reviewed and is accurate.   Trixie Dredgemily Rayla Pember, PA-C 03/14/15 1053  Gwyneth SproutWhitney Plunkett, MD 03/15/15 [phone]

## 2015-04-23 ENCOUNTER — Other Ambulatory Visit: Payer: Self-pay | Admitting: Orthopedic Surgery

## 2015-04-23 DIAGNOSIS — M545 Low back pain: Secondary | ICD-10-CM

## 2015-04-28 ENCOUNTER — Other Ambulatory Visit: Payer: Self-pay

## 2015-08-17 ENCOUNTER — Emergency Department (INDEPENDENT_AMBULATORY_CARE_PROVIDER_SITE_OTHER)
Admission: EM | Admit: 2015-08-17 | Discharge: 2015-08-17 | Disposition: A | Discharge status: Home / Self Care | Payer: 59 | Source: Home / Self Care | Attending: Emergency Medicine | Admitting: Emergency Medicine

## 2015-08-17 ENCOUNTER — Encounter (HOSPITAL_COMMUNITY): Payer: Self-pay | Admitting: Emergency Medicine

## 2015-08-17 DIAGNOSIS — S76312A Strain of muscle, fascia and tendon of the posterior muscle group at thigh level, left thigh, initial encounter: Secondary | ICD-10-CM | POA: Diagnosis not present

## 2015-08-17 DIAGNOSIS — M533 Sacrococcygeal disorders, not elsewhere classified: Secondary | ICD-10-CM | POA: Diagnosis not present

## 2015-08-17 DIAGNOSIS — G8929 Other chronic pain: Secondary | ICD-10-CM | POA: Diagnosis not present

## 2015-08-17 MED ORDER — KETOROLAC TROMETHAMINE 60 MG/2ML IM SOLN
60.0000 mg | Freq: Once | INTRAMUSCULAR | Status: AC
  Administered 2015-08-17: 60 mg via INTRAMUSCULAR

## 2015-08-17 MED ORDER — METHYLPREDNISOLONE ACETATE 80 MG/ML IJ SUSP
80.0000 mg | Freq: Once | INTRAMUSCULAR | Status: AC
  Administered 2015-08-17: 80 mg via INTRAMUSCULAR

## 2015-08-17 MED ORDER — KETOROLAC TROMETHAMINE 60 MG/2ML IM SOLN
INTRAMUSCULAR | Status: AC
  Filled 2015-08-17: qty 2

## 2015-08-17 MED ORDER — METHYLPREDNISOLONE ACETATE 80 MG/ML IJ SUSP
INTRAMUSCULAR | Status: AC
  Filled 2015-08-17: qty 1

## 2015-08-17 NOTE — ED Notes (Signed)
Patient c/o left side hip pain x 2 days. This is a chronic problem. Patient reports he usually has a flare up when the weather changes from hot to cold.Usually Toradol makes him feel better. Patient is in NAD.

## 2015-08-17 NOTE — Discharge Instructions (Signed)
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 This is where your hip bone connects to your back bone. You also have pain where your hamstrings attach in your blood. This is likely due to changing your gait when your hip hurts. Apply ice to the buttock area. We gave you some shots today to help with the pain. Use the Naprosyn and prednisone as prescribed by your primary care doctor. Follow-up with your PCP as scheduled.

## 2015-08-17 NOTE — ED Provider Notes (Signed)
CSN: [id number]     Arrival date & time 08/17/15  1856 History   First MD Initiated Contact with Patient 08/17/15 1918     Chief Complaint  Patient presents with  . Hip Pain   (Consider location/radiation/quality/duration/timing/severity/associated sxs/prior Treatment) HPI  He is a 24 year old man here for evaluation of left hip pain. He states this is a chronic issue, but has gotten worse in the last 2 days. He states this typically occurs with weather changes. He has had problems with both of his hips for the last 4-5 years. He denies any specific injury or trauma, but states he did play football, lift weights, and wrestle in high school.  The pain is located in the left lower back. He states he could tell he was walking differently, and that he developed a pain at the base of his left buttock. No numbness, tingling, weakness.  This is being evaluated by a physician with [facility].  He states he has Naprosyn and 20 mg prednisone tablets at home, but these have not been helping. In the past Toradol has helped.  History reviewed. No pertinent past medical history. Past Surgical History  Procedure Laterality Date  . Wisdom tooth extraction     No family history on file. Social History  Substance Use Topics  . Smoking status: Never Smoker   . Smokeless tobacco: Never Used  . Alcohol Use: No    Review of Systems As in history of present illness Allergies  Review of patient's allergies indicates no known allergies.  Home Medications   Prior to Admission medications   Medication Sig Start Date End Date Taking? Authorizing Provider  meloxicam (MOBIC) 15 MG tablet Take 1 tablet (15 mg total) by mouth daily. 03/12/15   Kaitlyn Szekalski, PA-C  methocarbamol (ROBAXIN) 750 MG tablet Take 1 tablet (750 mg total) by mouth every 6 (six) hours as needed for muscle spasms (and pain). 03/14/15   Trixie Dredge, PA-C  predniSONE (DELTASONE) 10 MG tablet 4 tabs PO QD for 4 days; 3 tabs PO QD for 3 days; 2 tabs  PO QD for 2 days; 1 tab PO QD for 1 day 12/25/14   Graylon Good, PA-C  predniSONE (DELTASONE) 20 MG tablet Take 2 tablets (40 mg total) by mouth daily. Take 40 mg by mouth daily for 3 days, then  by mouth daily for 3 days, then  daily for 3 days 12/25/14   Graylon Good, PA-C   Meds Ordered and Administered this Visit   Medications  methylPREDNISolone acetate (DEPO-MEDROL) injection 80 mg (not administered)  ketorolac (TORADOL) injection 60 mg (not administered)    BP 141/85 mmHg  Pulse 83  Temp(Src) 98 F (36.7 C) (Oral)  Resp 18  SpO2 98% No data found.   Physical Exam  Constitutional: He is oriented to person, place, and time. He appears well-developed and well-nourished. No distress.  Cardiovascular: Normal rate.   Pulmonary/Chest: Effort normal.  Musculoskeletal:  Back: No erythema or edema. No vertebral tenderness or step-offs. He is mildly tender to palpation in bilateral SI areas. Left hip: Mild discomfort with internal and extra rotation. Tender at ischial tuberosity. 5 out of 5 strength.  Neurological: He is alert and oriented to person, place, and time.    ED Course  Procedures (including critical care time)  Labs Review Labs Reviewed - No data to display  Imaging Review No results found.    MDM   1. Chronic left SI joint pain   2. Hamstring  muscle strain, left, initial encounter    Depo-Medrol 80 mg IM and Toradol 60 mg IM given. Recommended when necessary use of Naprosyn and prednisone. Follow-up with PCP as scheduled.   Charm Rings, MD 08/17/15 [phone]

## 2015-08-22 ENCOUNTER — Emergency Department (HOSPITAL_COMMUNITY)
Admission: EM | Admit: 2015-08-22 | Discharge: 2015-08-22 | Disposition: A | Discharge status: Home / Self Care | Payer: 59 | Attending: Emergency Medicine | Admitting: Emergency Medicine

## 2015-08-22 ENCOUNTER — Encounter (HOSPITAL_COMMUNITY): Payer: Self-pay | Admitting: *Deleted

## 2015-08-22 DIAGNOSIS — M533 Sacrococcygeal disorders, not elsewhere classified: Secondary | ICD-10-CM | POA: Insufficient documentation

## 2015-08-22 DIAGNOSIS — Z791 Long term (current) use of non-steroidal anti-inflammatories (NSAID): Secondary | ICD-10-CM | POA: Insufficient documentation

## 2015-08-22 DIAGNOSIS — M25552 Pain in left hip: Secondary | ICD-10-CM | POA: Diagnosis present

## 2015-08-22 HISTORY — DX: Pain in unspecified hip: M25.559

## 2015-08-22 HISTORY — DX: Other chronic pain: G89.29

## 2015-08-22 MED ORDER — KETOROLAC TROMETHAMINE 60 MG/2ML IM SOLN
60.0000 mg | Freq: Once | INTRAMUSCULAR | Status: AC
  Administered 2015-08-22: 60 mg via INTRAMUSCULAR
  Filled 2015-08-22: qty 2

## 2015-08-22 NOTE — ED Notes (Signed)
Pt presents to day with on going chronic hip pain. Pt last seen 08-17-15 for same.

## 2015-08-22 NOTE — Discharge Instructions (Signed)
Sacroiliac Joint Dysfunction °The sacroiliac joint connects the lower part of the spine (the sacrum) with the bones of the pelvis. °CAUSES  °Sometimes, there is no obvious reason for sacroiliac joint dysfunction. Other times, it may occur  °· During pregnancy. °· After injury, such as: °¨ Car accidents. °¨ Sport-related injuries. °¨ Work-related injuries. °· Due to one leg being shorter than the other. °· Due to other conditions that affect the joints, such as: °¨ Rheumatoid arthritis. °¨ Gout. °¨ Psoriasis. °¨ Joint infection (septic arthritis). °SYMPTOMS  °Symptoms may include: °· Pain in the: °¨ Lower back. °¨ Buttocks. °¨ Groin. °¨ Thighs and legs. °· Difficult sitting, standing, walking, lying, bending or lifting. °DIAGNOSIS  °A number of tests may be used to help diagnose the cause of sacroiliac joint dysfunction, including: °· Imaging tests to look for other causes of pain, including: °¨ MRI. °¨ CT scan. °¨ Bone scan. °· Diagnostic injection: During a special x-ray (called fluoroscopy), a needle is put into the sacroiliac joint. A numbing medicine is injected into the joint. If the pain is improved or stopped, the diagnosis of sacroiliac joint dysfunction is more likely. °TREATMENT  °There are a number of types of treatment used for sacroiliac joint dysfunction, including: °· Only take over-the-counter or prescription medicines for pain, discomfort, or fever as directed by your caregiver. °· Medications to relax muscles. °· Rest. Decreasing activity can help cut down on painful muscle spasms and allow the back to heal. °· Application of heat or ice to the lower back may improve muscle spasms and soothe pain. °· Brace. A special back brace, called a sacroiliac belt, can help support the joint while your back is healing. °· Physical therapy can help teach comfortable positions and exercises to strengthen muscles that support the sacroiliac joint. °· Cortisone injections. Injections of steroid medicine into the  joint can help decrease swelling and improve pain. °· Hyaluronic acid injections. This chemical improves lubrication within the sacroiliac joint, thereby decreasing pain. °· Radiofrequency ablation. A special needle is placed into the joint, where it burns away nerves that are carrying pain messages from the joint. °· Surgery. Because pain occurs during movement of the joint, screws and plates may be installed in order to limit or prevent joint motion. °HOME CARE INSTRUCTIONS  °· Take all medications exactly as directed. °· Follow instructions regarding both rest and physical activity, to avoid worsening the pain. °· Do physical therapy exercises exactly as prescribed. °SEEK IMMEDIATE MEDICAL CARE IF: °· You experience increasingly severe pain. °· You develop new symptoms, such as numbness or tingling in your legs or feet. °· You lose bladder or bowel control. °Document Released: 02/25/2009 Document Revised: 02/21/2012 Document Reviewed: 02/25/2009 °Exit[employer]® Patient Information ©2015 [employer]. This information is not intended to replace advice given to you by your health care provider. Make sure you discuss any questions you have with your health care provider. ° °

## 2015-08-22 NOTE — ED Provider Notes (Signed)
CSN: [id number]     Arrival date & time 08/22/15  1015 History  This chart was scribed for non-physician practitioner, Fayrene Helper, PA-C, working with Lyndal Pulley, MD, by Ronney Lion, ED Scribe. This patient was seen in room TR09C/TR09C and the patient's care was started at 10:32 AM.    No chief complaint on file.  The history is provided by the patient and medical records. No language interpreter was used.   HPI Comments: Cristian Mercado is a 24 y.o. male who presents to the Emergency Department complaining of acute on chronic exacerbation of left hip pain that has been ongoing for the last 6 years and worsened 1 year ago, with onset 1 week ago, per medical records. He was seen here 5 days ago on 08/17/15, and noted at that time his hip pain typically occurs with weather changes, and although he denies any known specific trauma or injury, he did play football, wrestle, and lift weights in high school. Patient is currently seen at [facility] for this, and before the last ED visit he had been given prednisone which he has not used, as it is ineffective. Dr. Piedad Climes at the last visit had given him Depo-Medrol 80 and Toradol 60 IM, and recommended Naprosyn and prednisone as needed. 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   Patient states he presents today because although he is normally able to walk on it, but he states that today he is unable to secondary to a severe, stabbing, shooting pain in his left hip. Patient had taken prednisone and Naprosyn 500 with no relief. Being startled triggers his pain. He reports he had seen his PCP recently, who ran blood tests and referred him to a rheumatologist. He denies a family history of any significant hip or joint problems. Patient states he works at the [employer], which requires standing for long periods of time. He adds that his pain recently has flared up due to the increased humidity recently. He denies any fever, rash, or dysuria.  No  past medical history on file. Past Surgical History  Procedure Laterality Date  . Wisdom tooth extraction     No family history on file. Social History  Substance Use Topics  . Smoking status: Never Smoker   . Smokeless tobacco: Never Used  . Alcohol Use: No    Review of Systems  Constitutional: Negative for fever.  Genitourinary: Negative for dysuria.  Musculoskeletal: Positive for arthralgias (left hip pain).  Skin: Negative for rash.      Allergies  Review of patient's allergies indicates no known allergies.  Home Medications   Prior to Admission medications   Medication Sig Start Date End Date Taking? Authorizing Provider  meloxicam (MOBIC) 15 MG tablet Take 1 tablet (15 mg total) by mouth daily. 03/12/15   Kaitlyn Szekalski, PA-C  methocarbamol (ROBAXIN) 750 MG tablet Take 1 tablet (750 mg total) by mouth every 6 (six) hours as needed for muscle spasms (and pain). 03/14/15   Trixie Dredge, PA-C  predniSONE (DELTASONE) 10 MG tablet 4 tabs PO QD for 4 days; 3 tabs PO QD for 3 days; 2 tabs PO QD for 2 days; 1 tab PO QD for 1 day 12/25/14   Graylon Good, PA-C  predniSONE (DELTASONE) 20 MG tablet Take 2 tablets (40 mg total) by mouth daily. Take 40 mg by mouth daily for 3 days, then 20mg  by mouth daily for 3 days, then 10mg  daily for 3 days 12/25/14  Graylon Good, PA-C   There were no vitals taken for this visit. Physical Exam  Constitutional: He is oriented to person, place, and time. He appears well-developed and well-nourished. No distress.  HENT:  Head: Normocephalic and atraumatic.  Eyes: Conjunctivae and EOM are normal.  Neck: Neck supple. No tracheal deviation present.  Cardiovascular: Normal rate.   Pulmonary/Chest: Effort normal. No respiratory distress.  Musculoskeletal: Normal range of motion. He exhibits tenderness.  No significant midline tenderness, crepitus, or step-offs. Mild tenderness with left hip lock-roll. Increased pain to left lateral hip with hip  abduction and adduction. Pain at the left SI joint.   Neurological: He is alert and oriented to person, place, and time.  Skin: Skin is warm and dry.  Psychiatric: He has a normal mood and affect. His behavior is normal.  Nursing note and vitals reviewed.   ED Course  Procedures (including critical care time)  DIAGNOSTIC STUDIES: Oxygen Saturation is 98% on RA, normal by my interpretation.    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 Discussed with patient that he may need to see a rheumatologist for treatment in order to address long-term relief of pain, and that we can only perform short-term relief in the ED today. Explained the risks and benefits of narcotic use, especially long-term narcotic use, in comparison to anti-inflammatory and steroid medications. Discussed treatment plan with pt at bedside which includes Toradol IM administered here today. Pt verbalized understanding and agreed to plan. Answered all of patient's and patient's family's questions.   MDM   Final diagnoses:  Pain of left sacroiliac joint   BP 131/78 mmHg  Pulse 82  Temp(Src) 98.2 F (36.8 C)  Resp 16  SpO2 100%   I personally performed the services described in this documentation, which was scribed in my presence. The recorded information has been reviewed and is accurate.      Fayrene Helper, PA-C 08/22/15 1054  Lyndal Pulley, MD 08/23/15 [phone]

## 2015-09-08 ENCOUNTER — Other Ambulatory Visit: Payer: Self-pay | Admitting: Physician Assistant

## 2015-09-08 DIAGNOSIS — M461 Sacroiliitis, not elsewhere classified: Secondary | ICD-10-CM

## 2015-09-20 ENCOUNTER — Ambulatory Visit
Admission: RE | Admit: 2015-09-20 | Discharge: 2015-09-20 | Disposition: A | Discharge status: Home / Self Care | Payer: 59 | Source: Ambulatory Visit | Attending: Physician Assistant | Admitting: Physician Assistant

## 2015-09-20 DIAGNOSIS — M461 Sacroiliitis, not elsewhere classified: Secondary | ICD-10-CM

## 2015-09-20 MED ORDER — GADOBENATE DIMEGLUMINE 529 MG/ML IV SOLN
18.0000 mL | Freq: Once | INTRAVENOUS | Status: AC | PRN
  Administered 2015-09-20: 18 mL via INTRAVENOUS

## 2016-02-16 ENCOUNTER — Other Ambulatory Visit: Payer: Self-pay | Admitting: Family Medicine

## 2016-02-16 DIAGNOSIS — R51 Headache: Principal | ICD-10-CM

## 2016-02-16 DIAGNOSIS — R519 Headache, unspecified: Secondary | ICD-10-CM

## 2016-03-01 ENCOUNTER — Ambulatory Visit
Admission: RE | Admit: 2016-03-01 | Discharge: 2016-03-01 | Disposition: A | Discharge status: Home / Self Care | Payer: PRIVATE HEALTH INSURANCE | Source: Ambulatory Visit | Attending: Family Medicine | Admitting: Family Medicine

## 2016-03-01 DIAGNOSIS — R519 Headache, unspecified: Secondary | ICD-10-CM

## 2016-03-01 DIAGNOSIS — R51 Headache: Principal | ICD-10-CM

## 2016-06-13 ENCOUNTER — Emergency Department (HOSPITAL_COMMUNITY)
Admission: EM | Admit: 2016-06-13 | Discharge: 2016-06-13 | Disposition: A | Discharge status: Home / Self Care | Payer: 59 | Attending: Emergency Medicine | Admitting: Emergency Medicine

## 2016-06-13 ENCOUNTER — Encounter (HOSPITAL_COMMUNITY): Payer: Self-pay | Admitting: Neurology

## 2016-06-13 DIAGNOSIS — M533 Sacrococcygeal disorders, not elsewhere classified: Secondary | ICD-10-CM | POA: Insufficient documentation

## 2016-06-13 DIAGNOSIS — G8929 Other chronic pain: Secondary | ICD-10-CM | POA: Insufficient documentation

## 2016-06-13 DIAGNOSIS — M25552 Pain in left hip: Secondary | ICD-10-CM | POA: Diagnosis present

## 2016-06-13 HISTORY — DX: Ankylosing spondylitis of unspecified sites in spine: M45.9

## 2016-06-13 LAB — I-STAT CHEM 8, ED
BUN: 14 mg/dL (ref 6–20)
CALCIUM ION: 1.22 mmol/L (ref 1.13–1.30)
Chloride: 104 mmol/L (ref 101–111)
Creatinine, Ser: 0.9 mg/dL (ref 0.61–1.24)
Glucose, Bld: 97 mg/dL (ref 65–99)
HEMATOCRIT: 44 % (ref 39.0–52.0)
Hemoglobin: 15 g/dL (ref 13.0–17.0)
Potassium: 4 mmol/L (ref 3.5–5.1)
Sodium: 140 mmol/L (ref 135–145)
TCO2: 25 mmol/L (ref 0–100)

## 2016-06-13 MED ORDER — KETOROLAC TROMETHAMINE 60 MG/2ML IM SOLN
60.0000 mg | Freq: Once | INTRAMUSCULAR | Status: AC
  Administered 2016-06-13: 60 mg via INTRAMUSCULAR
  Filled 2016-06-13: qty 2

## 2016-06-13 NOTE — Discharge Instructions (Signed)
Mr. Cristian Mercado,  Nice meeting you! Congrats on becoming a father! Please follow-up with your primary care provider. Return to the emergency department if you develop fevers, chills, inability to walk, new/worsening symptoms. Feel better soon!  S. Lane HackerNicole Shaleta Ruacho, PA-C

## 2016-06-13 NOTE — ED Notes (Signed)
Pt here reporting he has ankylosis spondylitis and c/o left upper posterior leg pain since yesterday. He thinks it may be attributed to the rain. Pt is ambulatory, requesting a Toradol injection for the pain, which has helped in the past. Was taking humira, but has gotten off schedule. Is a x 4. In NAD

## 2016-06-13 NOTE — ED Provider Notes (Signed)
CSN: [id number]     Arrival date & time 06/13/16  0751 History   First MD Initiated Contact with Patient 06/13/16 [phone]     Chief Complaint  Patient presents with  . Leg Pain   HPI   Cristian Mercado is a 25 y.o. male PMH significant for ankylosing spondylitis, chronic hip pain presenting with a 1 day history of acute on chronic "SI joint" pain. He describes his pain as left SI in location, radiating slightly past his left buttocks but not to knee, constant, aching, 7/10 pain scale, worsened with movement or palpation. He states he is taking Humira for his chronic ankylosing spondylitis pain but he has been unable to fill his prescription due to financial issues. He states his previous SI joint pain has been resolved with IM Toradol. He denies fevers, chills, nausea, vomiting, change in bowel or bladder habits, inability to walk, loss of bowel or bladder control.  Past Medical History  Diagnosis Date  . Chronic hip pain   . Ankylosing spondylitis [facility](HCC)    Past Surgical History  Procedure Laterality Date  . Wisdom tooth extraction     No family history on file. Social History  Substance Use Topics  . Smoking status: Never Smoker   . Smokeless tobacco: Never Used  . Alcohol Use: No    Review of Systems  Ten systems are reviewed and are negative for acute change except as noted in the HPI  Allergies  Review of patient's allergies indicates no known allergies.  Home Medications   Prior to Admission medications   Medication Sig Start Date End Date Taking? Authorizing Provider  meloxicam (MOBIC) 15 MG tablet Take 1 tablet (15 mg total) by mouth daily. 03/12/15   Kaitlyn Szekalski, PA-C  methocarbamol (ROBAXIN) 750 MG tablet Take 1 tablet (750 mg total) by mouth every 6 (six) hours as needed for muscle spasms (and pain). 03/14/15   Trixie DredgeEmily West, PA-C  predniSONE (DELTASONE) 10 MG tablet 4 tabs PO QD for 4 days; 3 tabs PO QD for 3 days; 2 tabs PO QD for 2 days; 1 tab PO QD for 1 day 12/25/14    Graylon GoodZachary H Baker, PA-C  predniSONE (DELTASONE) 20 MG tablet Take 2 tablets (40 mg total) by mouth daily. Take 40 mg by mouth daily for 3 days, then 20mg  by mouth daily for 3 days, then 10mg  daily for 3 days 12/25/14   Graylon GoodZachary H Baker, PA-C   BP 136/72 mmHg  Pulse 70  Temp(Src) 98.3 F (36.8 C) (Oral)  Resp 18  SpO2 100% Physical Exam  Constitutional: He appears well-developed and well-nourished. No distress.  HENT:  Head: Normocephalic and atraumatic.  Eyes: Conjunctivae are normal. Right eye exhibits no discharge. Left eye exhibits no discharge. No scleral icterus.  Neck: No tracheal deviation present.  Cardiovascular: Normal rate and intact distal pulses.   Pulmonary/Chest: Effort normal. No respiratory distress.  Abdominal: Soft. He exhibits no distension.  Musculoskeletal: Normal range of motion. He exhibits tenderness. He exhibits no edema.  Left SI joint tenderness. Neurovascularly intact bilaterally. Strength 5 out of 5 throughout. No midline cervical, thoracic, lumbar spinal tenderness  Lymphadenopathy:    He has no cervical adenopathy.  Neurological: He is alert. Coordination normal.  Skin: Skin is warm and dry. No rash noted. He is not diaphoretic. No erythema.  Psychiatric: He has a normal mood and affect. His behavior is normal.  Nursing note and vitals reviewed.  ED Course  Procedures  Labs Review Labs  Reviewed  I-STAT CHEM 8, ED   MDM   Final diagnoses:  Chronic SI joint pain   Patient with acute on chronic pain and is requesting toradol shot, as this has helped his pain in the past. SCr normal. No neurological deficits and normal neuro exam.  Patient can walk but states is painful.  No loss of bowel or bladder control.  No concern for cauda equina.  No fever, night sweats, weight loss, h/o cancer, IVDU.  RICE protocol and pain medicine indicated and discussed with patient.   Patient may be safely discharged home. Discussed reasons for return. Patient to follow-up  with primary care provider within one week. Patient in understanding and agreement with the plan.  Melton KrebsSamantha Nicole Tenita Cue, PA-C 06/13/16 [id number]  Doug SouSam Jacubowitz, MD 06/13/16 1705

## 2016-07-02 ENCOUNTER — Ambulatory Visit: Payer: PRIVATE HEALTH INSURANCE | Admitting: Podiatry

## 2016-07-22 ENCOUNTER — Encounter (HOSPITAL_COMMUNITY): Payer: Self-pay

## 2016-07-22 ENCOUNTER — Emergency Department (HOSPITAL_COMMUNITY)
Admission: EM | Admit: 2016-07-22 | Discharge: 2016-07-22 | Disposition: A | Discharge status: Home / Self Care | Payer: 59 | Attending: Physician Assistant | Admitting: Physician Assistant

## 2016-07-22 DIAGNOSIS — M25551 Pain in right hip: Secondary | ICD-10-CM | POA: Diagnosis present

## 2016-07-22 DIAGNOSIS — Z8739 Personal history of other diseases of the musculoskeletal system and connective tissue: Secondary | ICD-10-CM

## 2016-07-22 MED ORDER — KETOROLAC TROMETHAMINE 60 MG/2ML IM SOLN
60.0000 mg | Freq: Once | INTRAMUSCULAR | Status: AC
  Administered 2016-07-22: 60 mg via INTRAMUSCULAR
  Filled 2016-07-22: qty 2

## 2016-07-22 NOTE — ED Notes (Signed)
Patient reports decreased pain. Ambulatory to lobby with NAD.

## 2016-07-22 NOTE — ED Provider Notes (Signed)
[facility]-EMERGENCY DEPT Provider Note   CSN: [id number] Arrival date & time: 07/22/16  1721  First Provider Contact:   First MD Initiated Contact with Patient 07/22/16 1833     By signing my name below, I, Christy Sartorius, attest that this documentation has been prepared under the direction and in the presence of  Felicie Morn, NP. Electronically Signed: Christy Sartorius, ED Scribe. 07/22/16. 6:49 PM.   History   Chief Complaint Chief Complaint  Patient presents with  . Hip Pain   The history is provided by the patient. No language interpreter was used.  Hip Pain  This is a chronic problem. The current episode started 2 days ago. The problem occurs constantly. The problem has been gradually worsening. The symptoms are aggravated by walking and standing. Nothing relieves the symptoms. Treatments tried: ibuprofen. The treatment provided no relief.     HPI Comments:  Cristian Mercado is a 25 y.o. male with h/o ankylosing spondylitis who presents to the Emergency Department complaining of moderate, acute on chronic, right hip pain beginning two days ago and worsened today. He denies recent falls, injury or trauma. Pt states his pain is worsened by ambulation, weight bearing, and movement. Pt is normally able to manage his pain at home with ibuprofen, but it has not provided relief of his current episode of pain.  Pt states he usually receives a Toradol injection from his PCP for pain flare-ups with complete relief.  Pt denies weakness in BLE and additional complaints.    Past Medical History:  Diagnosis Date  . Ankylosing spondylitis (HCC)   . Chronic hip pain     There are no active problems to display for this patient.   Past Surgical History:  Procedure Laterality Date  . WISDOM TOOTH EXTRACTION         Home Medications    Prior to Admission medications   Medication Sig Start Date End Date Taking? Authorizing Provider  meloxicam (MOBIC) 15 MG tablet Take 1 tablet (15 mg  total) by mouth daily. 03/12/15   Kaitlyn Szekalski, PA-C  methocarbamol (ROBAXIN) 750 MG tablet Take 1 tablet (750 mg total) by mouth every 6 (six) hours as needed for muscle spasms (and pain). 03/14/15   Trixie Dredge, PA-C  predniSONE (DELTASONE) 10 MG tablet 4 tabs PO QD for 4 days; 3 tabs PO QD for 3 days; 2 tabs PO QD for 2 days; 1 tab PO QD for 1 day 12/25/14   Graylon Good, PA-C  predniSONE (DELTASONE) 20 MG tablet Take 2 tablets (40 mg total) by mouth daily. Take 40 mg by mouth daily for 3 days, then  by mouth daily for 3 days, then  daily for 3 days 12/25/14   Graylon Good, PA-C    Family History No family history on file.  Social History Social History  Substance Use Topics  . Smoking status: Never Smoker  . Smokeless tobacco: Never Used  . Alcohol use No     Allergies   Review of patient's allergies indicates no known allergies.   Review of Systems Review of Systems  Musculoskeletal: Positive for arthralgias (right hip).  Neurological: Negative for weakness.  All other systems reviewed and are negative.    Physical Exam Updated Vital Signs BP 140/75 (BP Location: Left Arm)   Pulse 75   Temp 99 F (37.2 C) (Oral)   Resp 18   Ht  (1.676 m)   Wt 194 lb (88 kg)   SpO2 100%  BMI 31.31 kg/m   Physical Exam  Constitutional: He appears well-developed and well-nourished.  HENT:  Head: Normocephalic.  Eyes: Conjunctivae are normal.  Cardiovascular: Normal rate, regular rhythm and normal heart sounds.   DP pulse 2+ on right.  Pulmonary/Chest: Effort normal and breath sounds normal. No respiratory distress. He has no wheezes. He has no rales.  Abdominal: Soft. Bowel sounds are normal. He exhibits no distension. There is no tenderness.  Musculoskeletal: Normal range of motion.  Reluctance to push against resistance due to the pain.    Neurological: He is alert.  Skin: Skin is warm and dry.  Psychiatric: He has a normal mood and affect. His behavior  is normal.  Nursing note and vitals reviewed.    ED Treatments / Results  Labs (all labs ordered are listed, but only abnormal results are displayed) Labs Reviewed - No data to display  EKG  EKG Interpretation None       Radiology No results found.  Procedures Procedures (including critical care time)  DIAGNOSTIC STUDIES:  Oxygen Saturation is 100% on RA, normal by my interpretation.    COORDINATION OF CARE:  6:37 PM Will treat with Toradol injection. Discussed treatment plan with pt at bedside and pt agreed to plan.   Medications Ordered in ED Medications  ketorolac (TORADOL) injection 60 mg (60 mg Intramuscular Given 07/22/16 1843)     Initial Impression / Assessment and Plan / ED Course  I have reviewed the triage vital signs and the nursing notes.  Pertinent labs & imaging results that were available during my care of the patient were reviewed by me and considered in my medical decision making (see chart for details).  Clinical Course    [name] presents to the ED for evaluation of right hip pain. Pt treated in the ED with IM Toradol. Conservative therapies discussed and recommended. Patient advised to follow up with PCP as needed, or with worsening symptoms. Patient appears stable for discharge at this time. Return precautions discussed and outlined in discharge paperwork. Patient is agreeable to plan.     Final Clinical Impressions(s) / ED Diagnoses   Final diagnoses:  Right hip pain  History of ankylosing spondylitis    New Prescriptions New Prescriptions   No medications on file   I personally performed the services described in this documentation, which was scribed in my presence. The recorded information has been reviewed and is accurate.     Felicie Mornavid Savva Beamer, NP 07/22/16 2311    Courteney Randall AnLyn Mackuen, MD 07/22/16 [id number]

## 2016-07-22 NOTE — ED Triage Notes (Signed)
Patient here with right hip inflammation x 2 days, states he needs some medication for his arthritis. NAD. Ambulatory with steady gait

## 2016-07-24 ENCOUNTER — Encounter (HOSPITAL_BASED_OUTPATIENT_CLINIC_OR_DEPARTMENT_OTHER): Payer: Self-pay

## 2016-07-24 ENCOUNTER — Telehealth (HOSPITAL_BASED_OUTPATIENT_CLINIC_OR_DEPARTMENT_OTHER): Payer: Self-pay

## 2016-07-29 ENCOUNTER — Emergency Department (HOSPITAL_COMMUNITY)
Admission: EM | Admit: 2016-07-29 | Discharge: 2016-07-29 | Disposition: A | Discharge status: Home / Self Care | Payer: 59 | Attending: Emergency Medicine | Admitting: Emergency Medicine

## 2016-07-29 ENCOUNTER — Encounter (HOSPITAL_COMMUNITY): Payer: Self-pay | Admitting: Emergency Medicine

## 2016-07-29 DIAGNOSIS — J029 Acute pharyngitis, unspecified: Secondary | ICD-10-CM | POA: Diagnosis not present

## 2016-07-29 DIAGNOSIS — M25552 Pain in left hip: Secondary | ICD-10-CM | POA: Diagnosis not present

## 2016-07-29 LAB — RAPID STREP SCREEN (MED CTR MEBANE ONLY): Streptococcus, Group A Screen (Direct): POSITIVE — AB

## 2016-07-29 MED ORDER — PREDNISONE 10 MG (21) PO TBPK: 10.0000 mg | ORAL_TABLET | Freq: Every day | ORAL | 0 refills | Status: DC

## 2016-07-29 MED ORDER — KETOROLAC TROMETHAMINE 60 MG/2ML IM SOLN
60.0000 mg | Freq: Once | INTRAMUSCULAR | Status: AC
  Administered 2016-07-29: 60 mg via INTRAMUSCULAR
  Filled 2016-07-29: qty 2

## 2016-07-29 NOTE — Discharge Instructions (Signed)
Medications: prednisone  Treatment: Take prednisone as prescribed for 12 days. You'll be called to 3 days if you're strep culture returns positive. At that time, antibiotic treatment will be arranged. Use over-the-counter throat sprays like Chloraseptic, warm saltwater gargles, ibuprofen or Tylenol for your pain.  Follow-up: Please follow-up with your rheumatologist at your scheduled appointment on Monday and your primary care provider for further evaluation of your chronic sore throat. Please return to the emergency department if you develop any new or worsening symptoms.

## 2016-07-29 NOTE — ED Provider Notes (Signed)
[facility]PT Provider Note   CSN: [id number] Arrival date & time: 07/29/16  1705  By signing my name below, I, Emmanuella Mensah, attest that this documentation has been prepared under the direction and in the presence of [employer], PA-C. Electronically Signed: Angelene Giovanni, ED Scribe. 07/29/16. 6:38 PM.    History   Chief Complaint Chief Complaint  Patient presents with  . Hip Pain   HPI Comments: Cristian Mercado is a 25 y.o. male with a hx of chronic hip pain and ankylosing spondylitis who presents to the Emergency Department complaining of ongoing gradually worsening moderate right hip pain onset several days ago consistent with his chronic hip pain flare up. He reports that he has had these issues since 2012 but was officially diagnosed in 2016. He notes that the pain is worse after standing for a while or ambulating. No alleviating factors noted. Pt has not tried any medications PTA. Pt explains that he was seen on 07/22/16 where he received Toradol IM which provided relief, however pt declined Prednisone at that time. He adds that he is here today because his pain has returned and requests prednisone. No recent falls, injuries, or any trauma. He denies any bowel/bladder incontinence, urinary symptoms, fever, chills, saddle anesthesia, chest pain, shortness of breath, abdominal pain, n/v, numbness/weakness to BLE, generalized rash, or any open wounds.  Pt also c/o ongoing moderate sore throat and pain with swallowing onset 2-3 weeks ago. He reports that he has been told that he may need to have his tonsils removed due to his hx of Tonsillitis. No alleviating factors noted. He has not tried any medications PTA. No fever or chills.    The history is provided by the patient. No language interpreter was used.    Past Medical History:  Diagnosis Date  . Ankylosing spondylitis (HCC)   . Chronic hip pain     There are no active problems to display for this patient.   Past  Surgical History:  Procedure Laterality Date  . WISDOM TOOTH EXTRACTION         Home Medications    Prior to Admission medications   Medication Sig Start Date End Date Taking? Authorizing Provider  meloxicam (MOBIC) 15 MG tablet Take 1 tablet (15 mg total) by mouth daily. 03/12/15   Kaitlyn Szekalski, PA-C  methocarbamol (ROBAXIN) 750 MG tablet Take 1 tablet (750 mg total) by mouth every 6 (six) hours as needed for muscle spasms (and pain). 03/14/15   Trixie Dredge, PA-C  predniSONE (STERAPRED UNI-PAK 21 TAB) 10 MG (21) TBPK tablet Take 1 tablet (10 mg total) by mouth daily. Take 6 tabs by mouth daily  for 2 days, then 5 tabs for 2 days, then 4 tabs for 2 days, then 3 tabs for 2 days, 2 tabs for 2 days, then 1 tab by mouth daily for 2 days 07/29/16   Emi Holes, PA-C    Family History No family history on file.  Social History Social History  Substance Use Topics  . Smoking status: Never Smoker  . Smokeless tobacco: Never Used  . Alcohol use No     Allergies   Review of patient's allergies indicates no known allergies.   Review of Systems Review of Systems  Constitutional: Negative for chills and fever.  HENT: Positive for sore throat and trouble swallowing. Negative for facial swelling.   Respiratory: Negative for shortness of breath.   Cardiovascular: Negative for chest pain.  Gastrointestinal: Negative for abdominal pain, nausea and  vomiting.  Genitourinary: Negative for dysuria, frequency and hematuria.  Musculoskeletal: Positive for arthralgias.  Skin: Negative for rash and wound.  Neurological: Negative for weakness and numbness.     Physical Exam Updated Vital Signs BP 124/75 (BP Location: Left Arm)   Pulse 82   Temp 98.3 F (36.8 C) (Oral)   Resp 17   Ht 5\' 6"  (1.676 m)   Wt 88 kg   SpO2 98%   BMI 31.31 kg/m   Physical Exam  Constitutional: He appears well-developed and well-nourished. No distress.  HENT:  Head: Normocephalic and atraumatic.    Mouth/Throat: Mucous membranes are normal. No trismus in the jaw. No uvula swelling. Oropharyngeal exudate, posterior oropharyngeal edema (mild) and posterior oropharyngeal erythema (mild) present. No tonsillar abscesses.  Eyes: Conjunctivae are normal. Pupils are equal, round, and reactive to light. Right eye exhibits no discharge. Left eye exhibits no discharge. No scleral icterus.  Neck: Normal range of motion. Neck supple. No thyromegaly present.  Cardiovascular: Normal rate, regular rhythm and normal heart sounds.  Exam reveals no gallop and no friction rub.   No murmur heard. Pulmonary/Chest: Effort normal and breath sounds normal. No stridor. No respiratory distress. He has no wheezes. He has no rales.  Abdominal: Soft. Bowel sounds are normal. He exhibits no distension. There is no tenderness. There is no rebound and no guarding.  Musculoskeletal: He exhibits no edema.  Lymphadenopathy:    He has no cervical adenopathy.  Neurological: He is alert. Coordination normal.  Normal sensation; 5/5 strength in bilateral lower extremities; patient ambulatory  Skin: Skin is warm and dry. No rash noted. He is not diaphoretic. No pallor.  Psychiatric: He has a normal mood and affect.  Nursing note and vitals reviewed.    ED Treatments / Results  DIAGNOSTIC STUDIES: Oxygen Saturation is 98% on RA, normal by my interpretation.    COORDINATION OF CARE: 6:13 PM- Pt advised of plan for treatment and pt agrees. Pt will receive Toradol. He will also receive a rapid strep screen for further evaluation.     Procedures Procedures (including critical care time)  Medications Ordered in ED Medications  ketorolac (TORADOL) injection 60 mg (60 mg Intramuscular Given 07/29/16 1853)     Initial Impression / Assessment and Plan / ED Course  Buel ReamAlexandra Meya Clutter, PA-C has reviewed the triage vital signs and the nursing notes.  Pertinent labs & imaging results that were available during my care of the patient  were reviewed by me and considered in my medical decision making (see chart for details).  Clinical Course    Patient with acute on chronic right hip and upper leg pain. Patient states that his pain is like his normal flareups from his ankylosing spondylitis. Normal neuro exam, no focal deficits. Patient given Toradol IM injection in ED and discharged with prednisone taper. Patient has a follow-up appointment with his rheumatologist on Monday. Return precautions discussed. Patient understands and agrees with plan. Patient vitals stable throughout ED course and discharged in satisfactory condition.  Patient had to leave due to transportation issues prior to rapid strep result. We agreed that I would call him with a positive result. Rapid strep was positive. I called patient several times on the phone number provided and there was no answer or voicemail to leave a message. I texted the patient and told him that I would leave his prescription at the front desk. I have not yet received a response.  Final Clinical Impressions(s) / ED Diagnoses   Final  diagnoses:  Left hip pain  Sore throat    New Prescriptions Discharge Medication List as of 07/29/2016  7:44 PM    START taking these medications   Details  predniSONE (STERAPRED UNI-PAK 21 TAB) 10 MG (21) TBPK tablet Take 1 tablet (10 mg total) by mouth daily. Take 6 tabs by mouth daily  for 2 days, then 5 tabs for 2 days, then 4 tabs for 2 days, then 3 tabs for 2 days, 2 tabs for 2 days, then 1 tab by mouth daily for 2 days, Starting Thu 07/29/2016, Print       I personally performed the services described in this documentation, which was scribed in my presence. The recorded information has been reviewed and is accurate.     Emi Holeslexandra M Keisuke Hollabaugh, PA-C 07/30/16 1240    Maia PlanJoshua G Long, MD 07/30/16 1321

## 2016-07-29 NOTE — ED Triage Notes (Signed)
Pt st's he was seen here on 8/10 for hip pain.  St's he was given a injection of Toradol that helped   St's pain returned again today.

## 2016-07-29 NOTE — ED Notes (Signed)
Pt stable, ambulatory, states understanding of discharge instructions 

## 2016-10-21 ENCOUNTER — Emergency Department (HOSPITAL_COMMUNITY)
Admission: EM | Admit: 2016-10-21 | Discharge: 2016-10-21 | Disposition: A | Discharge status: Home / Self Care | Payer: 59 | Attending: Emergency Medicine | Admitting: Emergency Medicine

## 2016-10-21 ENCOUNTER — Encounter (HOSPITAL_COMMUNITY): Payer: Self-pay | Admitting: Emergency Medicine

## 2016-10-21 DIAGNOSIS — M533 Sacrococcygeal disorders, not elsewhere classified: Secondary | ICD-10-CM | POA: Diagnosis not present

## 2016-10-21 MED ORDER — INDOMETHACIN 25 MG PO CAPS: 50.0000 mg | ORAL_CAPSULE | Freq: Three times a day (TID) | ORAL | 0 refills | Status: DC | PRN

## 2016-10-21 MED ORDER — PREDNISONE 20 MG PO TABS: 60.0000 mg | ORAL_TABLET | Freq: Every day | ORAL | 0 refills | Status: AC

## 2016-10-21 MED ORDER — KETOROLAC TROMETHAMINE 60 MG/2ML IM SOLN
60.0000 mg | Freq: Once | INTRAMUSCULAR | Status: AC
  Administered 2016-10-21: 60 mg via INTRAMUSCULAR
  Filled 2016-10-21: qty 2

## 2016-10-21 MED ORDER — PREDNISONE 20 MG PO TABS
60.0000 mg | ORAL_TABLET | Freq: Once | ORAL | Status: AC
  Administered 2016-10-21: 60 mg via ORAL
  Filled 2016-10-21: qty 3

## 2016-10-21 MED ORDER — METHOCARBAMOL 500 MG PO TABS: 500.0000 mg | ORAL_TABLET | Freq: Two times a day (BID) | ORAL | 0 refills | Status: DC

## 2016-10-21 NOTE — ED Triage Notes (Addendum)
Pt presents with "flare up" of ankylosing spondylitis that started 2 days ago; pt was exercising when the acute pain started; sts forgot to take ibuprofen at home.  Pt says it travels sometimes up his back, but usually remains in SI joint.  More severe on R side.

## 2016-10-21 NOTE — ED Provider Notes (Signed)
[facility] Provider Note   CSN: [id number] Arrival date & time: 10/21/16  [id number]     History   Chief Complaint Chief Complaint  Patient presents with  . Joint Pain    HPI Cristian Mercado is a 25 y.o. male.  HPI Pt is a 25 y/o male who presents to the ER with right SI joint pain that is recurrent, onset two days ago, but worse this morning, pain is sharp and stabbing, radiates to right buttock and proximal thigh, rated 8/10, exacerbated by movement.  He has a history of ankylosing spondylitis and his pain is consistent with a flare.  Began after exercising two days ago.  He did not take ibuprofen, which is what he usually takes for his chronic pain.  He is established with rheumatology, is working on getting Humira treatment, but currently not on any other medications.  He denies fever, edema, erythema, numbness, weakness, tingling, dysuria, urinary incontinence, saddle anesthesia.  No other acute or associated sx.   Past Medical History:  Diagnosis Date  . Ankylosing spondylitis (HCC)   . Chronic hip pain     There are no active problems to display for this patient.   Past Surgical History:  Procedure Laterality Date  . WISDOM TOOTH EXTRACTION         Home Medications    Prior to Admission medications   Medication Sig Start Date End Date Taking? Authorizing Provider  indomethacin (INDOCIN) 25 MG capsule Take 2 capsules (50 mg total) by mouth 3 (three) times daily as needed for moderate pain. When pain begins to improve reduce dose to 25 mg PO TID PRN 10/21/16   Danelle BerryLeisa Chonita Gadea, PA-C  meloxicam (MOBIC) 15 MG tablet Take 1 tablet (15 mg total) by mouth daily. 03/12/15   Kaitlyn Szekalski, PA-C  methocarbamol (ROBAXIN) 500 MG tablet Take 1 tablet (500 mg total) by mouth 2 (two) times daily. 10/21/16   Danelle BerryLeisa Apollos Tenbrink, PA-C  methocarbamol (ROBAXIN) 750 MG tablet Take 1 tablet (750 mg total) by mouth every 6 (six) hours as needed for muscle spasms (and pain). 03/14/15   Trixie DredgeEmily West,  PA-C  predniSONE (DELTASONE) 20 MG tablet Take 3 tablets (60 mg total) by mouth daily. 10/21/16 10/26/16  Danelle BerryLeisa Janin Kozlowski, PA-C  predniSONE (STERAPRED UNI-PAK 21 TAB) 10 MG (21) TBPK tablet Take 1 tablet (10 mg total) by mouth daily. Take 6 tabs by mouth daily  for 2 days, then 5 tabs for 2 days, then 4 tabs for 2 days, then 3 tabs for 2 days, 2 tabs for 2 days, then 1 tab by mouth daily for 2 days 07/29/16   Emi HolesAlexandra M Law, PA-C    Family History No family history on file.  Social History Social History  Substance Use Topics  . Smoking status: Never Smoker  . Smokeless tobacco: Never Used  . Alcohol use Yes     Comment: occasional     Allergies   Patient has no known allergies.   Review of Systems Review of Systems  All other systems reviewed and are negative.    Physical Exam Updated Vital Signs BP 120/86   Pulse 65   Temp 97.7 F (36.5 C) (Oral)   Resp 18   Ht 5\' 6"  (1.676 m)   Wt 89.8 kg   SpO2 99%   BMI 31.96 kg/m   Physical Exam  Constitutional: He is oriented to person, place, and time. He appears well-developed and well-nourished. No distress.  HENT:  Head: Normocephalic and atraumatic.  Right Ear: External ear normal.  Left Ear: External ear normal.  Nose: Nose normal.  Mouth/Throat: Oropharynx is clear and moist. No oropharyngeal exudate.  Eyes: Conjunctivae and EOM are normal. Pupils are equal, round, and reactive to light. Right eye exhibits no discharge. Left eye exhibits no discharge. No scleral icterus.  Neck: Normal range of motion. Neck supple. No JVD present. No tracheal deviation present.  Cardiovascular: Normal rate, regular rhythm and intact distal pulses.   Pulmonary/Chest: Effort normal and breath sounds normal. No stridor. No respiratory distress.  Abdominal: Soft. Bowel sounds are normal. He exhibits no distension. There is no tenderness.  Musculoskeletal: Normal range of motion. He exhibits tenderness. He exhibits no edema or deformity.        Back:  Right SI joint ttp, no midline tenderness or stepoff from cervical to lumbar spine.  Normal ROM  Lymphadenopathy:    He has no cervical adenopathy.  Neurological: He is alert and oriented to person, place, and time. No sensory deficit. He exhibits normal muscle tone. Coordination normal.  Symmetrical and normal sensation to light touch and strength in LE, antalgic gait  Skin: Skin is warm and dry. Capillary refill takes less than 2 seconds. No rash noted. He is not diaphoretic. No erythema. No pallor.  Psychiatric: He has a normal mood and affect. His behavior is normal. Judgment and thought content normal.  Nursing note and vitals reviewed.    ED Treatments / Results  Labs (all labs ordered are listed, but only abnormal results are displayed) Labs Reviewed - No data to display  EKG  EKG Interpretation None       Radiology No results found.  Procedures Procedures (including critical care time)  Medications Ordered in ED Medications  ketorolac (TORADOL) injection 60 mg (60 mg Intramuscular Given 10/21/16 0716)  predniSONE (DELTASONE) tablet 60 mg (60 mg Oral Given 10/21/16 0716)     Initial Impression / Assessment and Plan / ED Course  I have reviewed the triage vital signs and the nursing notes.  Pertinent labs & imaging results that were available during my care of the patient were reviewed by me and considered in my medical decision making (see chart for details).  Clinical Course    Pt with AS, presents with right SI joint pain consistent with AS flare.  No other associated sx, no treatment attempted PTA.  On exam pt has right SI joint ttp, normal sensation, strength, ROM and grossly normal gait.  Pt afebrile, well appearing.  Given toradol and prednisone.  Will d/c with prednisone 5 day burst and indomethacin taper.  Encouraged to follow up with PCP or rheumatology as needed.  Return precautions reviewed, pt verbalized understanding, agrees with plan.  Pt  discharged home in good condition with VSS.  Final Clinical Impressions(s) / ED Diagnoses   Final diagnoses:  SI (sacroiliac) pain    New Prescriptions New Prescriptions   INDOMETHACIN (INDOCIN) 25 MG CAPSULE    Take 2 capsules (50 mg total) by mouth 3 (three) times daily as needed for moderate pain. When pain begins to improve reduce dose to 25 mg PO TID PRN   METHOCARBAMOL (ROBAXIN) 500 MG TABLET    Take 1 tablet (500 mg total) by mouth 2 (two) times daily.   PREDNISONE (DELTASONE) 20 MG TABLET    Take 3 tablets (60 mg total) by mouth daily.     Danelle BerryLeisa Delmi Fulfer, PA-C 10/21/16 [id number]    Dione Boozeavid Glick, MD 10/21/16 [phone]

## 2016-11-11 DIAGNOSIS — Y990 Civilian activity done for income or pay: Secondary | ICD-10-CM | POA: Diagnosis not present

## 2016-11-11 DIAGNOSIS — M25552 Pain in left hip: Secondary | ICD-10-CM | POA: Diagnosis not present

## 2016-11-11 DIAGNOSIS — Y939 Activity, unspecified: Secondary | ICD-10-CM | POA: Diagnosis not present

## 2016-11-11 DIAGNOSIS — X500XXA Overexertion from strenuous movement or load, initial encounter: Secondary | ICD-10-CM | POA: Insufficient documentation

## 2016-11-11 DIAGNOSIS — Y929 Unspecified place or not applicable: Secondary | ICD-10-CM | POA: Diagnosis not present

## 2016-11-11 NOTE — ED Notes (Signed)
Pt called x 2 for triage with no answer 

## 2016-11-12 ENCOUNTER — Encounter (HOSPITAL_COMMUNITY): Payer: Self-pay | Admitting: Emergency Medicine

## 2016-11-12 ENCOUNTER — Emergency Department (HOSPITAL_COMMUNITY)
Admission: EM | Admit: 2016-11-12 | Discharge: 2016-11-12 | Disposition: A | Discharge status: Home / Self Care | Payer: 59 | Attending: Emergency Medicine | Admitting: Emergency Medicine

## 2016-11-12 DIAGNOSIS — M25552 Pain in left hip: Secondary | ICD-10-CM

## 2016-11-12 MED ORDER — KETOROLAC TROMETHAMINE 30 MG/ML IJ SOLN
30.0000 mg | Freq: Once | INTRAMUSCULAR | Status: AC
  Administered 2016-11-12: 30 mg via INTRAMUSCULAR
  Filled 2016-11-12: qty 1

## 2016-11-12 MED ORDER — INDOMETHACIN 25 MG PO CAPS
25.0000 mg | ORAL_CAPSULE | Freq: Three times a day (TID) | ORAL | 0 refills | Status: DC | PRN
Start: 2016-11-12 — End: 2019-01-19

## 2016-11-12 NOTE — ED Triage Notes (Signed)
Patient reports left hip pain onset this evening , denies injury/ ambulatory.

## 2016-11-12 NOTE — ED Provider Notes (Signed)
[facility] Provider Note   CSN: [id number] Arrival date & time: 11/11/16  2339     History   Chief Complaint Chief Complaint  Patient presents with  . Hip Pain    HPI Cristian Mercado is an 25 y.o. male with history of ankylosing spondylitis who presents to the ED for evaluation of left SI joint/hip pain. He states he was in his usual state of health until this evening when he accidentally overdid it at work and picked up something heavy. He states his AS will flare up if he does too much at work. Denies new numbness or weakness. States his pain is in his left hip though typically his right hip gives him more problems. Denies fever, chills, bowel or bladder incontinence, or saddle paresthesias. He was seen in the ED about one month ago for an AS flare and prescribed indomethacin; pt states toradol and indomethacin have worked well for him in the past. He has an appt next week with rheum to get started on humira therapy. He has not tried anything for his pain today.  Past Medical History:  Diagnosis Date  . Ankylosing spondylitis (HCC)   . Chronic hip pain     There are no active problems to display for this patient.   Past Surgical History:  Procedure Laterality Date  . WISDOM TOOTH EXTRACTION         Home Medications    Prior to Admission medications   Medication Sig Start Date End Date Taking? Authorizing Provider  indomethacin (INDOCIN) 25 MG capsule Take 2 capsules (50 mg total) by mouth 3 (three) times daily as needed for moderate pain. When pain begins to improve reduce dose to 25 mg PO TID PRN 10/21/16   Danelle BerryLeisa Tapia, PA-C  meloxicam (MOBIC) 15 MG tablet Take 1 tablet (15 mg total) by mouth daily. 03/12/15   Kaitlyn Szekalski, PA-C  methocarbamol (ROBAXIN) 500 MG tablet Take 1 tablet (500 mg total) by mouth 2 (two) times daily. 10/21/16   Danelle BerryLeisa Tapia, PA-C  methocarbamol (ROBAXIN) 750 MG tablet Take 1 tablet (750 mg total) by mouth every 6 (six) hours as needed for  muscle spasms (and pain). 03/14/15   Trixie DredgeEmily West, PA-C  predniSONE (STERAPRED UNI-PAK 21 TAB) 10 MG (21) TBPK tablet Take 1 tablet (10 mg total) by mouth daily. Take 6 tabs by mouth daily  for 2 days, then 5 tabs for 2 days, then 4 tabs for 2 days, then 3 tabs for 2 days, 2 tabs for 2 days, then 1 tab by mouth daily for 2 days 07/29/16   Emi HolesAlexandra M Law, PA-C    Family History No family history on file.  Social History Social History  Substance Use Topics  . Smoking status: Never Smoker  . Smokeless tobacco: Never Used  . Alcohol use Yes     Comment: occasional     Allergies   Patient has no known allergies.   Review of Systems Review of Systems 10 Systems reviewed and are negative for acute change except as noted in the HPI.   Physical Exam Updated Vital Signs BP 127/72 (BP Location: Left Arm)   Pulse 62   Temp 98.2 F (36.8 C) (Oral)   Resp 16   Ht 5\' 6"  (1.676 m)   Wt 90.3 kg   SpO2 99%   BMI 32.12 kg/m   Physical Exam  Constitutional: He is oriented to person, place, and time. No distress.  HENT:  Head: Atraumatic.  Right Ear:  External ear normal.  Left Ear: External ear normal.  Nose: Nose normal.  Eyes: Conjunctivae are normal. No scleral icterus.  Cardiovascular: Normal rate and regular rhythm.   Pulmonary/Chest: Effort normal. No respiratory distress.  Abdominal: He exhibits no distension.  Musculoskeletal:  No midline back tenderness. Left lumbar paraspinal and SI joint tenderness. Mild tenderness extending through left lateral hip.  FROM of bilateral LE Strength and sensation intact throughout Steady gait  Neurological: He is alert and oriented to person, place, and time.  Skin: Skin is warm and dry. He is not diaphoretic.  Psychiatric: He has a normal mood and affect. His behavior is normal.  Nursing note and vitals reviewed.    ED Treatments / Results  Labs (all labs ordered are listed, but only abnormal results are displayed) Labs Reviewed -  No data to display  EKG  EKG Interpretation None       Radiology No results found.  Procedures Procedures (including critical care time)  Medications Ordered in ED Medications  ketorolac (TORADOL) 30 MG/ML injection 30 mg (30 mg Intramuscular Given 11/12/16 0045)     Initial Impression / Assessment and Plan / ED Course  I have reviewed the triage vital signs and the nursing notes.  Pertinent labs & imaging results that were available during my care of the patient were reviewed by me and considered in my medical decision making (see chart for details).  Clinical Course    Pt with pain consistent with his AS pain. No red flags for cauda equina. Neuro exam intact and reassuring. Will give toradol here and rx for indomethacin since it has worked well for him in the past. Encouraged keeping f/u with rheumatology next week to initiate humira therapy. ER return precautions given.  Final Clinical Impressions(s) / ED Diagnoses   Final diagnoses:  Left hip pain    New Prescriptions New Prescriptions   INDOMETHACIN (INDOCIN) 25 MG CAPSULE    Take 1 capsule (25 mg total) by mouth 3 (three) times daily as needed.     Carlene CoriaSerena Y Gradie Butrick, PA-C 11/12/16 [id number]    Glynn OctaveStephen Rancour, MD 11/12/16 [phone]

## 2016-11-12 NOTE — Discharge Instructions (Signed)
Take medication as prescribed. Get your Humira next week as scheduled. Return to the ER for new or worsening symptoms.

## 2016-11-12 NOTE — ED Notes (Signed)
Pt states he is supposed to be starting [place] soon for medical condition.

## 2016-11-12 NOTE — ED Notes (Signed)
PA at bedside.

## 2016-12-05 ENCOUNTER — Emergency Department (HOSPITAL_COMMUNITY)
Admission: EM | Admit: 2016-12-05 | Discharge: 2016-12-05 | Disposition: A | Discharge status: Home / Self Care | Payer: 59 | Attending: Emergency Medicine | Admitting: Emergency Medicine

## 2016-12-05 ENCOUNTER — Encounter (HOSPITAL_COMMUNITY): Payer: Self-pay | Admitting: Emergency Medicine

## 2016-12-05 DIAGNOSIS — R519 Headache, unspecified: Secondary | ICD-10-CM

## 2016-12-05 DIAGNOSIS — Z79899 Other long term (current) drug therapy: Secondary | ICD-10-CM | POA: Insufficient documentation

## 2016-12-05 DIAGNOSIS — R51 Headache: Secondary | ICD-10-CM | POA: Insufficient documentation

## 2016-12-05 DIAGNOSIS — R109 Unspecified abdominal pain: Secondary | ICD-10-CM

## 2016-12-05 DIAGNOSIS — R55 Syncope and collapse: Secondary | ICD-10-CM | POA: Diagnosis present

## 2016-12-05 DIAGNOSIS — B349 Viral infection, unspecified: Secondary | ICD-10-CM | POA: Diagnosis not present

## 2016-12-05 LAB — CBC WITH DIFFERENTIAL/PLATELET
Basophils Absolute: 0 10*3/uL (ref 0.0–0.1)
Basophils Relative: 0 %
EOS ABS: 0 10*3/uL (ref 0.0–0.7)
EOS PCT: 0 %
HCT: 43.9 % (ref 39.0–52.0)
Hemoglobin: 15.5 g/dL (ref 13.0–17.0)
LYMPHS ABS: 1.1 10*3/uL (ref 0.7–4.0)
Lymphocytes Relative: 8 %
MCH: 30.5 pg (ref 26.0–34.0)
MCHC: 35.3 g/dL (ref 30.0–36.0)
MCV: 86.4 fL (ref 78.0–100.0)
MONOS PCT: 6 %
Monocytes Absolute: 0.9 10*3/uL (ref 0.1–1.0)
Neutro Abs: 11.5 10*3/uL — ABNORMAL HIGH (ref 1.7–7.7)
Neutrophils Relative %: 86 %
PLATELETS: 232 10*3/uL (ref 150–400)
RBC: 5.08 MIL/uL (ref 4.22–5.81)
RDW: 12.8 % (ref 11.5–15.5)
WBC: 13.5 10*3/uL — AB (ref 4.0–10.5)

## 2016-12-05 LAB — URINALYSIS, ROUTINE W REFLEX MICROSCOPIC
BILIRUBIN URINE: NEGATIVE
Glucose, UA: NEGATIVE mg/dL
HGB URINE DIPSTICK: NEGATIVE
Ketones, ur: NEGATIVE mg/dL
Leukocytes, UA: NEGATIVE
Nitrite: NEGATIVE
PROTEIN: NEGATIVE mg/dL
Specific Gravity, Urine: 1.018 (ref 1.005–1.030)
pH: 5 (ref 5.0–8.0)

## 2016-12-05 LAB — COMPREHENSIVE METABOLIC PANEL
ALK PHOS: 68 U/L (ref 38–126)
ALT: 33 U/L (ref 17–63)
AST: 26 U/L (ref 15–41)
Albumin: 4 g/dL (ref 3.5–5.0)
Anion gap: 10 (ref 5–15)
BUN: 14 mg/dL (ref 6–20)
CHLORIDE: 105 mmol/L (ref 101–111)
CO2: 21 mmol/L — ABNORMAL LOW (ref 22–32)
CREATININE: 1.07 mg/dL (ref 0.61–1.24)
Calcium: 9.5 mg/dL (ref 8.9–10.3)
GFR calc Af Amer: >60 mL/min (ref >60)
Glucose, Bld: 114 mg/dL — ABNORMAL HIGH (ref 65–99)
Potassium: 4.1 mmol/L (ref 3.5–5.1)
Sodium: 136 mmol/L (ref 135–145)
Total Bilirubin: 0.7 mg/dL (ref 0.3–1.2)
Total Protein: 7.2 g/dL (ref 6.5–8.1)

## 2016-12-05 MED ORDER — DIPHENHYDRAMINE HCL 50 MG/ML IJ SOLN
25.0000 mg | Freq: Once | INTRAMUSCULAR | Status: AC
  Administered 2016-12-05: 25 mg via INTRAVENOUS
  Filled 2016-12-05: qty 1

## 2016-12-05 MED ORDER — SODIUM CHLORIDE 0.9 % IV BOLUS (SEPSIS)
1000.0000 mL | Freq: Once | INTRAVENOUS | Status: AC
  Administered 2016-12-05: 1000 mL via INTRAVENOUS

## 2016-12-05 MED ORDER — METOCLOPRAMIDE HCL 5 MG/ML IJ SOLN
10.0000 mg | Freq: Once | INTRAMUSCULAR | Status: AC
  Administered 2016-12-05: 10 mg via INTRAVENOUS
  Filled 2016-12-05: qty 2

## 2016-12-05 NOTE — ED Triage Notes (Signed)
Pt arrives via [facility] for c/o bilateral flank pain that woke him from sleep. Ems reports pt tried to have a bowel movement and got dizzy when bearing down, had near syncopal episode, no LOC. Ems reports no orthostatic changes. Pt received 4mg  zofran pta, a/ox4, resp e/u, nad.

## 2016-12-05 NOTE — ED Notes (Signed)
ED Provider at bedside. 

## 2016-12-05 NOTE — Discharge Instructions (Signed)
Drink plenty of fluids. Take acetaminophen, ibuprofen, or naproxen as needed for pain.Return if symptoms are getting worse.

## 2016-12-05 NOTE — ED Provider Notes (Signed)
[facility]-EMERGENCY DEPT Provider Note   CSN: [id number] Arrival date & time: 12/05/16  0506     History   Chief Complaint Chief Complaint  Patient presents with  . Flank Pain    HPI Donte Valentina Gu Klatt is a 25 y.o. male.  He states that he was fatigued yesterday and slept all day. Tonight, he developed bilateral flank pain. He went to the bathroom to urinate and also had to strain in the bowel movement. While on the commode, he started feeling sweaty, lightheaded and developed nausea. He had a near syncopal episode. Pain was rated at 9/10 at its worst. His flank pain has subsided to 6/10 and nausea and diaphoresis of resolved. He is now complaining of a generalized headache. He denies fever or chills. He denies any sick contacts.   The history is provided by the patient.  Flank Pain     Past Medical History:  Diagnosis Date  . Ankylosing spondylitis (HCC)   . Chronic hip pain     There are no active problems to display for this patient.   Past Surgical History:  Procedure Laterality Date  . WISDOM TOOTH EXTRACTION         Home Medications    Prior to Admission medications   Medication Sig Start Date End Date Taking? Authorizing Provider  indomethacin (INDOCIN) 25 MG capsule Take 2 capsules (50 mg total) by mouth 3 (three) times daily as needed for moderate pain. When pain begins to improve reduce dose to 25 mg PO TID PRN 10/21/16   Danelle BerryLeisa Tapia, PA-C  indomethacin (INDOCIN) 25 MG capsule Take 1 capsule (25 mg total) by mouth 3 (three) times daily as needed. 11/12/16   Ace GinsSerena Y Sam, PA-C  meloxicam (MOBIC) 15 MG tablet Take 1 tablet (15 mg total) by mouth daily. 03/12/15   Kaitlyn Szekalski, PA-C  methocarbamol (ROBAXIN) 500 MG tablet Take 1 tablet (500 mg total) by mouth 2 (two) times daily. 10/21/16   Danelle BerryLeisa Tapia, PA-C  methocarbamol (ROBAXIN) 750 MG tablet Take 1 tablet (750 mg total) by mouth every 6 (six) hours as needed for muscle spasms (and pain). 03/14/15   Trixie DredgeEmily West, PA-C   predniSONE (STERAPRED UNI-PAK 21 TAB) 10 MG (21) TBPK tablet Take 1 tablet (10 mg total) by mouth daily. Take 6 tabs by mouth daily  for 2 days, then 5 tabs for 2 days, then 4 tabs for 2 days, then 3 tabs for 2 days, 2 tabs for 2 days, then 1 tab by mouth daily for 2 days 07/29/16   Emi HolesAlexandra M Law, PA-C    Family History No family history on file.  Social History Social History  Substance Use Topics  . Smoking status: Never Smoker  . Smokeless tobacco: Never Used  . Alcohol use Yes     Comment: occasional     Allergies   Patient has no known allergies.   Review of Systems Review of Systems  Genitourinary: Positive for flank pain.  All other systems reviewed and are negative.    Physical Exam Updated Vital Signs BP 124/74   Pulse 92   Temp 98.8 F (37.1 C) (Oral)   Resp 12   Ht 5\' 6"  (1.676 m)   Wt 199 lb (90.3 kg)   SpO2 100%   BMI 32.12 kg/m   Physical Exam  Nursing note and vitals reviewed.  25 year old male, resting comfortably and in no acute distress. Vital signs are Normal. Oxygen saturation is 100%, which is normal. Head  is normocephalic and atraumatic. PERRLA, EOMI. Oropharynx is clear. Neck is nontender and supple without adenopathy or JVD. Back is nontender and there is no CVA tenderness. Lungs are clear without rales, wheezes, or rhonchi. Chest is nontender. Heart has regular rate and rhythm without murmur. Abdomen is soft, flat, nontender without masses or hepatosplenomegaly and peristalsis is normoactive. Extremities have no cyanosis or edema, full range of motion is present. Skin is warm and dry without rash. Neurologic: Mental status is normal, cranial nerves are intact, there are no motor or sensory deficits.  ED Treatments / Results  Labs (all labs ordered are listed, but only abnormal results are displayed) Labs Reviewed  COMPREHENSIVE METABOLIC PANEL - Abnormal; Notable for the following:       Result Value   CO2 21 (*)    Glucose,  Bld 114 (*)    All other components within normal limits  CBC WITH DIFFERENTIAL/PLATELET - Abnormal; Notable for the following:    WBC 13.5 (*)    Neutro Abs 11.5 (*)    All other components within normal limits  URINALYSIS, ROUTINE W REFLEX MICROSCOPIC    EKG  EKG Interpretation  Date/Time:  Sunday December 05 2016 05:09:09 EST Ventricular Rate:  82 PR Interval:    QRS Duration: 103 QT Interval:  353 QTC Calculation: 413 R Axis:   107 Text Interpretation:  Sinus rhythm Ventricular trigeminy Left atrial enlargement Borderline right axis deviation Low voltage, precordial leads ST elev, probable normal early repol pattern When compared with ECG of 11/21/2007, Premature ventricular complexes are now Present Confirmed by [facility]  MD, Gurjit Loconte ([id number]) on 12/05/2016 5:16:35 AM       Procedures Procedures (including critical care time)  Medications Ordered in ED Medications  sodium chloride 0.9 % bolus 1,000 mL (1,000 mLs Intravenous New Bag/Given 12/05/16 0601)  metoCLOPramide (REGLAN) injection 10 mg (10 mg Intravenous Given 12/05/16 0601)  diphenhydrAMINE (BENADRYL) injection 25 mg (25 mg Intravenous Given 12/05/16 0601)     Initial Impression / Assessment and Plan / ED Course  I have reviewed the triage vital signs and the nursing notes.  Pertinent lab results that were available during my care of the patient were reviewed by me and considered in my medical decision making (see chart for details).  Clinical Course    Bilateral flank pain of uncertain cause. Headache which appears to be benign. Near syncopal episode which sounds vagal and may been related to straining at bowel movement. ECG shows presence of PVCs but is otherwise unremarkable. Screening labs are ordered. He is given normal saline and diphenhydramine for his headache and will be reassessed. Old records are reviewed, and he has no relevant past visits.  Laboratory work shows mild leukocytosis. He had excellent relief  of his headache with above noted treatment. No red flags to suggest anything other than a viral illness and vasovagal near-syncope. He is discharged with instructions to drink plenty of fluids, take over-the-counter analgesics as needed. Return should symptoms worsen.  Final Clinical Impressions(s) / ED Diagnoses   Final diagnoses:  Viral illness  Bilateral flank pain  Vasovagal near syncope  Headache, unspecified headache type    New Prescriptions New Prescriptions   No medications on file     Dione Boozeavid Elyanna Wallick, MD 12/05/16 [phone]

## 2017-08-11 ENCOUNTER — Emergency Department (HOSPITAL_COMMUNITY)
Admission: EM | Admit: 2017-08-11 | Discharge: 2017-08-11 | Disposition: A | Discharge status: Home / Self Care | Payer: 59 | Attending: Emergency Medicine | Admitting: Emergency Medicine

## 2017-08-11 DIAGNOSIS — R05 Cough: Secondary | ICD-10-CM | POA: Insufficient documentation

## 2017-08-11 DIAGNOSIS — Z5321 Procedure and treatment not carried out due to patient leaving prior to being seen by health care provider: Secondary | ICD-10-CM | POA: Insufficient documentation

## 2017-08-11 DIAGNOSIS — R51 Headache: Secondary | ICD-10-CM | POA: Insufficient documentation

## 2017-08-11 LAB — URINALYSIS, ROUTINE W REFLEX MICROSCOPIC
Bilirubin Urine: NEGATIVE
GLUCOSE, UA: NEGATIVE mg/dL
Hgb urine dipstick: NEGATIVE
Ketones, ur: NEGATIVE mg/dL
LEUKOCYTES UA: NEGATIVE
Nitrite: NEGATIVE
PROTEIN: NEGATIVE mg/dL
SPECIFIC GRAVITY, URINE: 1.028 (ref 1.005–1.030)
pH: 6 (ref 5.0–8.0)

## 2017-08-11 LAB — CBC
HCT: 44.7 % (ref 39.0–52.0)
Hemoglobin: 15.1 g/dL (ref 13.0–17.0)
MCH: 30.1 pg (ref 26.0–34.0)
MCHC: 33.8 g/dL (ref 30.0–36.0)
MCV: 89.2 fL (ref 78.0–100.0)
PLATELETS: 262 10*3/uL (ref 150–400)
RBC: 5.01 MIL/uL (ref 4.22–5.81)
RDW: 13.3 % (ref 11.5–15.5)
WBC: 13 10*3/uL — ABNORMAL HIGH (ref 4.0–10.5)

## 2017-08-11 LAB — CBG MONITORING, ED: GLUCOSE-CAPILLARY: 105 mg/dL — AB (ref 65–99)

## 2017-08-11 LAB — BASIC METABOLIC PANEL
Anion gap: 9 (ref 5–15)
BUN: 14 mg/dL (ref 6–20)
CALCIUM: 9.4 mg/dL (ref 8.9–10.3)
CHLORIDE: 106 mmol/L (ref 101–111)
CO2: 24 mmol/L (ref 22–32)
CREATININE: 1.02 mg/dL (ref 0.61–1.24)
GFR calc Af Amer: >60 mL/min (ref >60)
GFR calc non Af Amer: >60 mL/min (ref >60)
GLUCOSE: 108 mg/dL — AB (ref 65–99)
Potassium: 3.7 mmol/L (ref 3.5–5.1)
Sodium: 139 mmol/L (ref 135–145)

## 2017-08-11 NOTE — ED Triage Notes (Signed)
Pt reports cough and headache for several weeks. Non productive cough. Denies fevers. States he almost passed out at work so he decided to come in.

## 2017-08-11 NOTE — ED Notes (Signed)
No answer for XRAY

## 2017-09-05 ENCOUNTER — Ambulatory Visit
Admission: RE | Admit: 2017-09-05 | Discharge: 2017-09-05 | Disposition: A | Discharge status: Home / Self Care | Payer: POS | Source: Ambulatory Visit | Attending: Family Medicine | Admitting: Family Medicine

## 2017-09-05 ENCOUNTER — Other Ambulatory Visit: Payer: Self-pay | Admitting: Family Medicine

## 2017-09-05 DIAGNOSIS — M549 Dorsalgia, unspecified: Secondary | ICD-10-CM

## 2018-01-17 ENCOUNTER — Encounter: Payer: Self-pay | Admitting: Family Medicine

## 2018-03-01 ENCOUNTER — Encounter (HOSPITAL_COMMUNITY): Payer: Self-pay

## 2018-03-01 ENCOUNTER — Emergency Department (HOSPITAL_COMMUNITY)
Admission: EM | Admit: 2018-03-01 | Discharge: 2018-03-01 | Disposition: A | Discharge status: Home / Self Care | Payer: POS | Attending: Emergency Medicine | Admitting: Emergency Medicine

## 2018-03-01 ENCOUNTER — Other Ambulatory Visit: Payer: Self-pay

## 2018-03-01 DIAGNOSIS — Y929 Unspecified place or not applicable: Secondary | ICD-10-CM | POA: Diagnosis not present

## 2018-03-01 DIAGNOSIS — S3992XA Unspecified injury of lower back, initial encounter: Secondary | ICD-10-CM | POA: Diagnosis present

## 2018-03-01 DIAGNOSIS — X58XXXA Exposure to other specified factors, initial encounter: Secondary | ICD-10-CM | POA: Insufficient documentation

## 2018-03-01 DIAGNOSIS — S39012A Strain of muscle, fascia and tendon of lower back, initial encounter: Secondary | ICD-10-CM | POA: Diagnosis not present

## 2018-03-01 DIAGNOSIS — Y999 Unspecified external cause status: Secondary | ICD-10-CM | POA: Insufficient documentation

## 2018-03-01 DIAGNOSIS — Z79899 Other long term (current) drug therapy: Secondary | ICD-10-CM | POA: Diagnosis not present

## 2018-03-01 DIAGNOSIS — R300 Dysuria: Secondary | ICD-10-CM | POA: Insufficient documentation

## 2018-03-01 DIAGNOSIS — Y939 Activity, unspecified: Secondary | ICD-10-CM | POA: Insufficient documentation

## 2018-03-01 LAB — URINALYSIS, ROUTINE W REFLEX MICROSCOPIC
BILIRUBIN URINE: NEGATIVE
Glucose, UA: NEGATIVE mg/dL
Hgb urine dipstick: NEGATIVE
KETONES UR: NEGATIVE mg/dL
LEUKOCYTES UA: NEGATIVE
NITRITE: NEGATIVE
PROTEIN: NEGATIVE mg/dL
Specific Gravity, Urine: 1.024 (ref 1.005–1.030)
pH: 7 (ref 5.0–8.0)

## 2018-03-01 MED ORDER — CYCLOBENZAPRINE HCL 10 MG PO TABS: 10.0000 mg | ORAL_TABLET | Freq: Two times a day (BID) | ORAL | 0 refills | Status: DC | PRN

## 2018-03-01 MED ORDER — NAPROXEN 500 MG PO TABS: 500.0000 mg | ORAL_TABLET | Freq: Two times a day (BID) | ORAL | 0 refills | Status: DC

## 2018-03-01 NOTE — ED Provider Notes (Signed)
[facility] EMERGENCY DEPARTMENT Provider Note   CSN: [id number] Arrival date & time: 03/01/18  1121     History   Chief Complaint Chief Complaint  Patient presents with  . Dysuria    HPI Cristian Mercado is a 27 y.o. male with past medical history of chronic hip pain, ankylosing spondylitis, who presents to ED for evaluation of bilateral lower back pain, dysuria for the past 3 days.  Describes the back pain as aching, worse with palpation and movement. Unsure if this is related to his AS. States that this feels similar to a prior UTI that he has had.  He has tried ibuprofen with no improvement in his symptoms.  Denies any fevers, hematuria, history of kidney stones, injuries or falls, numbness in legs, loss of bowel or bladder function, history of cancer, history of IV drug use, penile discharge, concern for STDs, rashes, genital lesions.  HPI  Past Medical History:  Diagnosis Date  . Ankylosing spondylitis (HCC)   . Chronic hip pain     There are no active problems to display for this patient.   Past Surgical History:  Procedure Laterality Date  . WISDOM TOOTH EXTRACTION         Home Medications    Prior to Admission medications   Medication Sig Start Date End Date Taking? Authorizing Provider  cyclobenzaprine (FLEXERIL) 10 MG tablet Take 1 tablet (10 mg total) by mouth 2 (two) times daily as needed for muscle spasms. 03/01/18   Kataryna Mcquilkin, PA-C  indomethacin (INDOCIN) 25 MG capsule Take 2 capsules (50 mg total) by mouth 3 (three) times daily as needed for moderate pain. When pain begins to improve reduce dose to 25 mg PO TID PRN 10/21/16   Danelle Berry, PA-C  indomethacin (INDOCIN) 25 MG capsule Take 1 capsule (25 mg total) by mouth 3 (three) times daily as needed. 11/12/16   Sam, Ace Gins, PA-C  meloxicam (MOBIC) 15 MG tablet Take 1 tablet (15 mg total) by mouth daily. 03/12/15   Emilia Beck, PA-C  methocarbamol (ROBAXIN) 500 MG tablet Take 1  tablet (500 mg total) by mouth 2 (two) times daily. 10/21/16   Danelle Berry, PA-C  methocarbamol (ROBAXIN) 750 MG tablet Take 1 tablet (750 mg total) by mouth every 6 (six) hours as needed for muscle spasms (and pain). 03/14/15   Trixie Dredge, PA-C  naproxen (NAPROSYN) 500 MG tablet Take 1 tablet (500 mg total) by mouth 2 (two) times daily. 03/01/18   Vernie Piet, PA-C  predniSONE (STERAPRED UNI-PAK 21 TAB) 10 MG (21) TBPK tablet Take 1 tablet (10 mg total) by mouth daily. Take 6 tabs by mouth daily  for 2 days, then 5 tabs for 2 days, then 4 tabs for 2 days, then 3 tabs for 2 days, 2 tabs for 2 days, then 1 tab by mouth daily for 2 days 07/29/16   Emi Holes, PA-C    Family History No family history on file.  Social History Social History   Tobacco Use  . Smoking status: Never Smoker  . Smokeless tobacco: Never Used  Substance Use Topics  . Alcohol use: Yes    Comment: occasional  . Drug use: No     Allergies   Patient has no known allergies.   Review of Systems Review of Systems  Constitutional: Negative for appetite change, chills and fever.  HENT: Negative for ear pain, rhinorrhea, sneezing and sore throat.   Eyes: Negative for photophobia and visual disturbance.  Respiratory: Negative for cough, chest tightness, shortness of breath and wheezing.   Cardiovascular: Negative for chest pain and palpitations.  Gastrointestinal: Negative for abdominal pain, blood in stool, constipation, diarrhea, nausea and vomiting.  Genitourinary: Positive for dysuria. Negative for flank pain, hematuria and urgency.  Musculoskeletal: Positive for back pain. Negative for myalgias.  Skin: Negative for rash.  Neurological: Negative for dizziness, weakness and light-headedness.     Physical Exam Updated Vital Signs BP 112/75   Pulse (!) 55   Temp 98.3 F (36.8 C) (Oral)   Resp 16   SpO2 99%   Physical Exam  Constitutional: He appears well-developed and well-nourished. No distress.    HENT:  Head: Normocephalic and atraumatic.  Nose: Nose normal.  Eyes: Conjunctivae and EOM are normal. Left eye exhibits no discharge. No scleral icterus.  Neck: Normal range of motion. Neck supple.  Cardiovascular: Normal rate, regular rhythm, normal heart sounds and intact distal pulses. Exam reveals no gallop and no friction rub.  No murmur heard. Pulmonary/Chest: Effort normal and breath sounds normal. No respiratory distress.  Abdominal: Soft. Bowel sounds are normal. He exhibits no distension. There is no tenderness. There is no guarding.  No CVA tenderness bilaterally.  Musculoskeletal: Normal range of motion. He exhibits tenderness. He exhibits no edema.       Back:  No midline spinal tenderness present in lumbar, thoracic or cervical spine. No step-off palpated. No visible bruising, edema or temperature change noted. No objective signs of numbness present. No saddle anesthesia. 2+ DP pulses bilaterally. Sensation intact to light touch. Strength 5/5 in bilateral lower extremities.  Neurological: He is alert. He exhibits normal muscle tone. Coordination normal.  Skin: Skin is warm and dry. No rash noted.  Psychiatric: He has a normal mood and affect.  Nursing note and vitals reviewed.    ED Treatments / Results  Labs (all labs ordered are listed, but only abnormal results are displayed) Labs Reviewed  URINALYSIS, ROUTINE W REFLEX MICROSCOPIC  GC[facility]IA PROBE AMP () [facility]    EKG  EKG Interpretation None       Radiology No results found.  Procedures Procedures (including critical care time)  Medications Ordered in ED Medications - No data to display   Initial Impression / Assessment and Plan / ED Course  I have reviewed the triage vital signs and the nursing notes.  Pertinent labs & imaging results that were available during my care of the patient were reviewed by me and considered in my medical decision making (see chart for details).      Patient presents to ED for evaluation of dysuria, bilateral lower back pain for the past 3 days.  He cannot recall any inciting event that may have triggered the pain.  He does have a history of ankylosing spondylitis so is unsure if the back pain is related to that.  He has no midline spinal tenderness to palpation and no deficits on his neurological examination that would concern me for cauda equina or other spinal cord injury, Doubt dissection, pyelonephritis as his urinalysis shows no evidence of UTI.  Will send for gonorrhea and chlamydia screen since patient is sexually active but denies any genital sores, rashes or penile discharge.  Will treat for muscular pain with anti-inflammatories and muscle relaxer.  Advised to follow-up with the results of remaining lab work when available. Strict return precautions given.  Portions of this note were generated with Scientist, clinical (histocompatibility and immunogenetics)Dragon dictation software. Dictation errors may occur despite best attempts  at proofreading.   Final Clinical Impressions(s) / ED Diagnoses   Final diagnoses:  Strain of lumbar region, initial encounter  Dysuria    ED Discharge Orders        Ordered    naproxen (NAPROSYN) 500 MG tablet  2 times daily     03/01/18 1246    cyclobenzaprine (FLEXERIL) 10 MG tablet  2 times daily PRN     03/01/18 1246       Dietrich Pates, PA-C 03/01/18 1251    Jacalyn Lefevre, MD 03/01/18 1549

## 2018-03-01 NOTE — ED Triage Notes (Signed)
Patient complains of dysuriia and bladder pain x 3 days, NAD

## 2018-03-01 NOTE — Discharge Instructions (Signed)
We will contact you with the results of your remaining lab work when it is available. °

## 2018-03-06 NOTE — ED Notes (Signed)
03/06/2018,  Pt. Called for his STI results. Pt. Does not have any STD results noted.  Informed pt. And pt. Verbalized understanding .  All questions answered.

## 2019-01-15 ENCOUNTER — Encounter: Payer: Self-pay | Admitting: Internal Medicine

## 2019-01-19 ENCOUNTER — Other Ambulatory Visit (INDEPENDENT_AMBULATORY_CARE_PROVIDER_SITE_OTHER): Payer: POS

## 2019-01-19 ENCOUNTER — Encounter: Payer: Self-pay | Admitting: Internal Medicine

## 2019-01-19 ENCOUNTER — Ambulatory Visit: Payer: POS | Admitting: Internal Medicine

## 2019-01-19 VITALS — BP 122/80 | HR 78 | Ht 66.0 in | Wt 205.0 lb

## 2019-01-19 DIAGNOSIS — R12 Heartburn: Secondary | ICD-10-CM | POA: Diagnosis not present

## 2019-01-19 DIAGNOSIS — R1012 Left upper quadrant pain: Secondary | ICD-10-CM | POA: Diagnosis not present

## 2019-01-19 LAB — CBC WITH DIFFERENTIAL/PLATELET
Basophils Absolute: 0 10*3/uL (ref 0.0–0.1)
Basophils Relative: 0.4 % (ref 0.0–3.0)
Eosinophils Absolute: 0.2 10*3/uL (ref 0.0–0.7)
Eosinophils Relative: 2.3 % (ref 0.0–5.0)
HCT: 44.3 % (ref 39.0–52.0)
Hemoglobin: 15.4 g/dL (ref 13.0–17.0)
LYMPHS ABS: 2.9 10*3/uL (ref 0.7–4.0)
Lymphocytes Relative: 31.5 % (ref 12.0–46.0)
MCHC: 34.7 g/dL (ref 30.0–36.0)
MCV: 89.5 fl (ref 78.0–100.0)
MONOS PCT: 6.8 % (ref 3.0–12.0)
Monocytes Absolute: 0.6 10*3/uL (ref 0.1–1.0)
NEUTROS PCT: 59 % (ref 43.0–77.0)
Neutro Abs: 5.4 10*3/uL (ref 1.4–7.7)
Platelets: 307 10*3/uL (ref 150.0–400.0)
RBC: 4.95 Mil/uL (ref 4.22–5.81)
RDW: 13.6 % (ref 11.5–15.5)
WBC: 9.1 10*3/uL (ref 4.0–10.5)

## 2019-01-19 NOTE — Patient Instructions (Signed)
  Your provider has requested that you go to the basement level for lab work before leaving today. Press "B" on the elevator. The lab is located at the first door on the left as you exit the elevator.    I appreciate the opportunity to care for you. Carl Gessner, MD, FACG 

## 2019-01-19 NOTE — Progress Notes (Signed)
   Cristian Mercado 27 y.o. [birth year] [id number] Referred by: Mila Palmer, MD  Assessment & Plan:   Encounter Diagnoses  Name Primary?  . LUQ pain Yes  . Heartburn    Sounds like he could have H. pylori gastritis, peptic gastritis ulcer disease reflux.  Most of his symptoms are in the left upper quadrant and his reflux symptoms do not predominate. Check CBC, check H. pylori stool antigen.  Treat H. pylori if positive.  Empiric acid suppression with H2 blocker versus PPI if H. pylori negative.  CC: Mila Palmer, MD   Subjective:   Chief Complaint:  HPI The patient is a 28 year old African-American man on Humira for ankylosing spondylitis with a 1 year or more history of borborygmi, left upper quadrant discomfort and intermittent heartburn.  Sometimes the rumbling in his abdomen bothers him at night.  There is no change in bowel habits or dysphagia or unintentional weight loss.  He did previously take a large amount of NSAIDs for his ankylosing spondylitis but that been a couple of years or more.  GI review of systems is otherwise negative.  He has not tried any over-the-counter medications. No Known Allergies Current Meds  Medication Sig  . Adalimumab (HUMIRA PEN) 40 MG/0.8ML PNKT    Past Medical History:  Diagnosis Date  . Ankylosing spondylitis (HCC)   . Chronic hip pain    Past Surgical History:  Procedure Laterality Date  . WISDOM TOOTH EXTRACTION     Social History   Social History Narrative   Married   Employed at the [employer]   Occasional alcohol, no tobacco or drug use    Family history is negative for GI problems  Review of Systems Chronic hip pain no dyspnea no chest pain  Objective:   Physical Exam @BP  122/80   Pulse 78   Ht 5\' 6"  (1.676 m)   Wt 205 lb (93 kg)   BMI 33.09 kg/m @  General:  Well-developed, well-nourished and in no acute distress Eyes:  anicteric. Lungs: Clear to auscultation bilaterally. Heart:  S1S2, no rubs,  murmurs, gallops. Abdomen:  soft, non-tender, no hepatosplenomegaly, hernia, or mass and BS+.  Psych:  appropriate mood and  Affect.

## 2019-01-22 ENCOUNTER — Other Ambulatory Visit: Payer: POS

## 2019-01-22 DIAGNOSIS — R1012 Left upper quadrant pain: Secondary | ICD-10-CM

## 2019-01-24 ENCOUNTER — Other Ambulatory Visit: Payer: Self-pay

## 2019-01-24 LAB — H. PYLORI ANTIGEN, STOOL: H pylori Ag, Stl: NEGATIVE

## 2019-01-24 MED ORDER — FAMOTIDINE 20 MG PO TABS: 20.0000 mg | ORAL_TABLET | Freq: Two times a day (BID) | ORAL | 3 refills | Status: AC

## 2019-01-24 NOTE — Progress Notes (Signed)
H pylori negative CBC ok  Rx famoticine 20 mg po bid (OTC is ok) x 2 months and see me in about 6-8 weeks

## 2019-01-24 NOTE — Progress Notes (Signed)
pepcid

## 2019-03-08 ENCOUNTER — Telehealth: Payer: Self-pay

## 2019-03-08 NOTE — Telephone Encounter (Signed)
Covid-19 travel screening questions  Have you traveled in the last 14 days? no If yes where?  Do you now or have you had a fever in the last 14 days? no  Do you have any respiratory symptoms of shortness of breath or cough now or in the last 14 days? no  Do you have a medical history of Congestive Heart Failure? no  Do you have a medical history of lung disease? no  Do you have any family members or close contacts with diagnosed or suspected Covid-19? no     Patient has appointment 03/13/2019 and wants to come in , he declines a Webx or phone visit.

## 2019-03-09 ENCOUNTER — Telehealth: Payer: Self-pay

## 2019-03-09 NOTE — Telephone Encounter (Signed)
Left message on patient's home # as his cell # just rings busy, busy for him to call us back. We must change his appointment from in person to [facility] or [facility].

## 2019-03-12 NOTE — Telephone Encounter (Signed)
Left another message for patient to call us ASAP to change visit to a WEBEx visit for tomorrow.

## 2019-03-13 ENCOUNTER — Ambulatory Visit: Payer: POS | Admitting: Internal Medicine

## 2019-03-13 NOTE — Telephone Encounter (Signed)
Patient called in and cancelled visit due to work conflict.

## 2019-04-11 IMAGING — CR DG THORACOLUMBAR SPINE 2V
2 series · 2 of 2 positions shown · non-contrast
Comparison: Chest x-ray of December 05, 2014.

CLINICAL DATA: Ionic mid back pain for several months with
increased symptoms over the past 3 months greatest at night.

EXAM:
THORACOLUMBAR SPINE 1V

[w thoraco-lumbar junction ap]
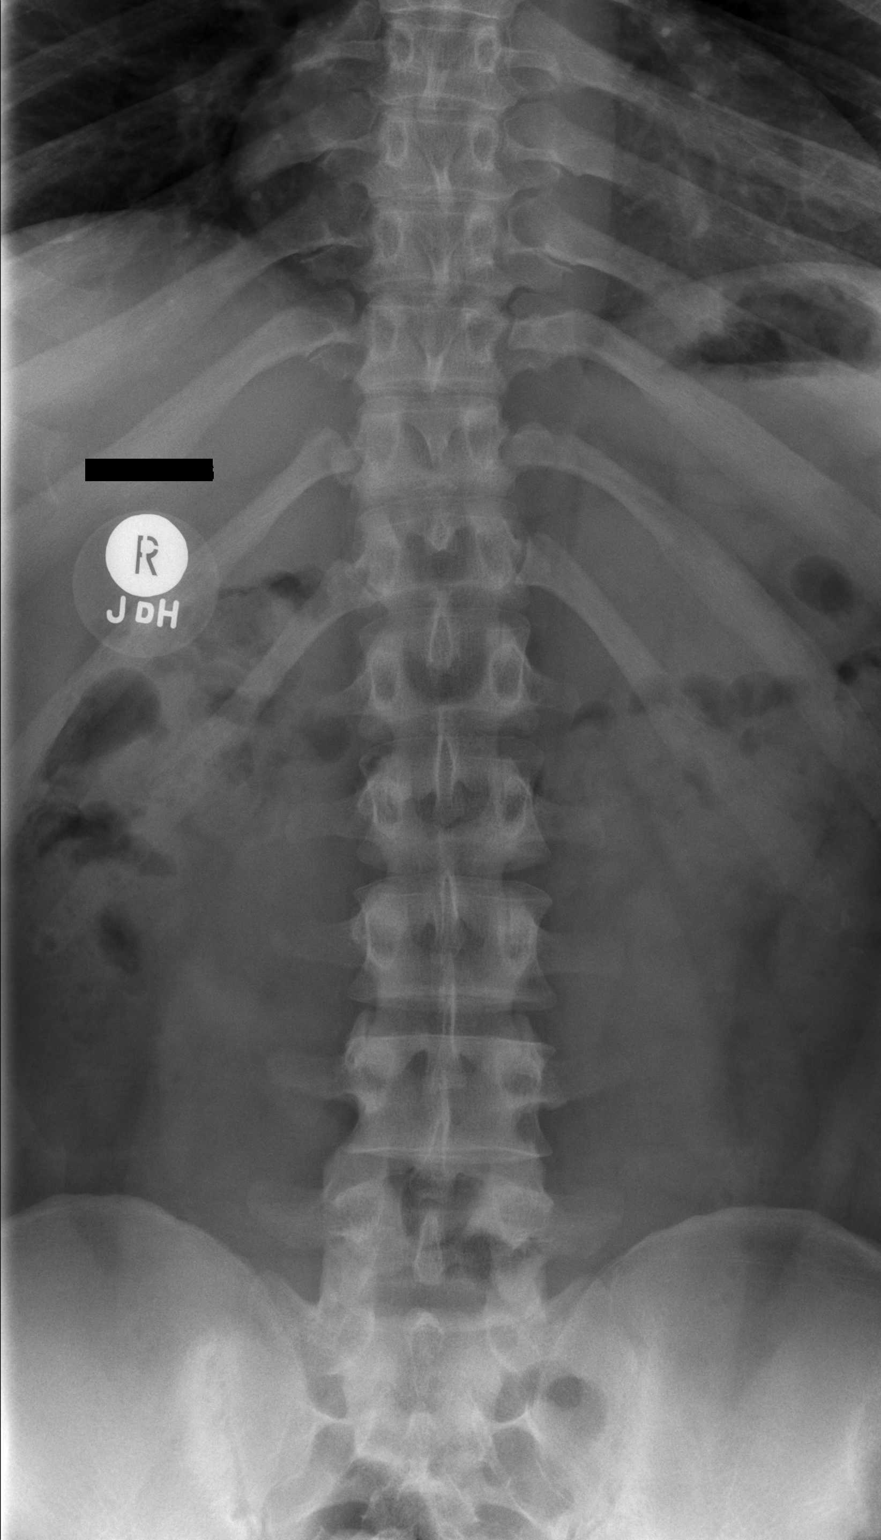

[w thoraco-lumbar junction lat]
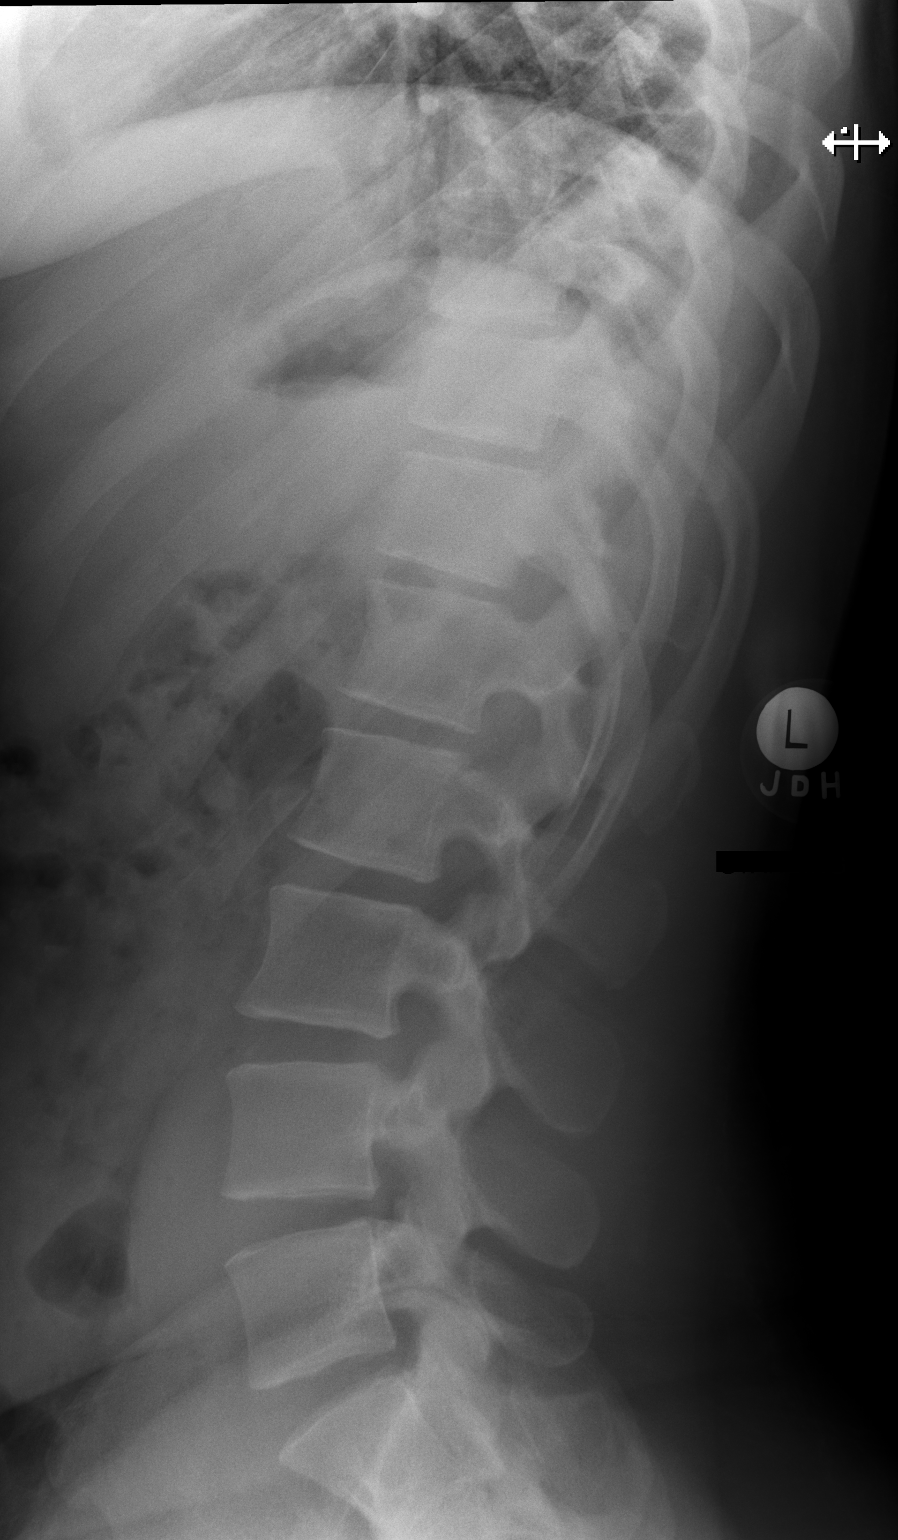

[2 of 2 positions shown; findings below may reference images not displayed]

FINDINGS: The lumbar vertebral bodies are preserved in height. The disc space
heights are well maintained. There is no spondylolisthesis. There is
no significant facet joint hypertrophy. The pedicles and transverse
processes are intact. There is some scleroses on the iliac side of
both SI joints.
IMPRESSION: Findings compatible with sacroiliitis bilaterally. No significant
abnormality of the lumbar spine is observed.

## 2019-08-24 ENCOUNTER — Other Ambulatory Visit: Payer: Self-pay

## 2019-08-24 DIAGNOSIS — Z20822 Contact with and (suspected) exposure to covid-19: Secondary | ICD-10-CM

## 2019-08-26 LAB — NOVEL CORONAVIRUS, NAA: SARS-CoV-2, NAA: NOT DETECTED

## 2019-10-12 ENCOUNTER — Other Ambulatory Visit: Payer: Self-pay

## 2019-10-12 DIAGNOSIS — Z20822 Contact with and (suspected) exposure to covid-19: Secondary | ICD-10-CM

## 2019-10-14 LAB — NOVEL CORONAVIRUS, NAA: SARS-CoV-2, NAA: NOT DETECTED

## 2020-01-07 ENCOUNTER — Ambulatory Visit: Payer: POS | Attending: Internal Medicine

## 2020-01-07 DIAGNOSIS — Z20822 Contact with and (suspected) exposure to covid-19: Secondary | ICD-10-CM

## 2020-01-08 LAB — NOVEL CORONAVIRUS, NAA: SARS-CoV-2, NAA: NOT DETECTED

## 2020-01-22 ENCOUNTER — Other Ambulatory Visit: Payer: POS

## 2020-01-25 ENCOUNTER — Other Ambulatory Visit: Payer: POS

## 2020-03-14 ENCOUNTER — Ambulatory Visit: Payer: POS | Attending: Internal Medicine

## 2020-03-14 DIAGNOSIS — Z20822 Contact with and (suspected) exposure to covid-19: Secondary | ICD-10-CM

## 2020-03-15 LAB — NOVEL CORONAVIRUS, NAA: SARS-CoV-2, NAA: NOT DETECTED

## 2020-03-15 LAB — SARS-COV-2, NAA 2 DAY TAT

## 2020-03-24 ENCOUNTER — Other Ambulatory Visit: Payer: POS

## 2020-07-17 ENCOUNTER — Other Ambulatory Visit: Payer: POS

## 2021-08-15 ENCOUNTER — Other Ambulatory Visit: Payer: Self-pay

## 2021-08-15 ENCOUNTER — Encounter (HOSPITAL_COMMUNITY): Payer: Self-pay

## 2021-08-15 ENCOUNTER — Emergency Department (HOSPITAL_COMMUNITY): Payer: POS

## 2021-08-15 ENCOUNTER — Emergency Department (HOSPITAL_COMMUNITY)
Admission: EM | Admit: 2021-08-15 | Discharge: 2021-08-15 | Disposition: A | Discharge status: Home / Self Care | Payer: POS | Attending: Emergency Medicine | Admitting: Emergency Medicine

## 2021-08-15 DIAGNOSIS — M7989 Other specified soft tissue disorders: Secondary | ICD-10-CM | POA: Insufficient documentation

## 2021-08-15 DIAGNOSIS — S0181XA Laceration without foreign body of other part of head, initial encounter: Secondary | ICD-10-CM | POA: Diagnosis not present

## 2021-08-15 DIAGNOSIS — Z23 Encounter for immunization: Secondary | ICD-10-CM | POA: Diagnosis not present

## 2021-08-15 DIAGNOSIS — S0993XA Unspecified injury of face, initial encounter: Secondary | ICD-10-CM | POA: Diagnosis present

## 2021-08-15 DIAGNOSIS — M25571 Pain in right ankle and joints of right foot: Secondary | ICD-10-CM

## 2021-08-15 MED ORDER — LIDOCAINE HCL (PF) 1 % IJ SOLN
5.0000 mL | Freq: Once | INTRAMUSCULAR | Status: AC
  Administered 2021-08-15: 5 mL via INTRADERMAL
  Filled 2021-08-15: qty 5

## 2021-08-15 MED ORDER — TETANUS-DIPHTH-ACELL PERTUSSIS 5-2.5-18.5 LF-MCG/0.5 IM SUSY
0.5000 mL | PREFILLED_SYRINGE | Freq: Once | INTRAMUSCULAR | Status: AC
  Administered 2021-08-15: 0.5 mL via INTRAMUSCULAR
  Filled 2021-08-15: qty 0.5

## 2021-08-15 NOTE — Discharge Instructions (Addendum)
Keep sutures clean and dry.  They will need to be removed in 7-10 days with your primary care doctor. Once sutures are removed, can use mederma to help reduce scarring. Return here for new concerns.
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
## 2023-01-07 ENCOUNTER — Emergency Department (HOSPITAL_COMMUNITY)
Admission: EM | Admit: 2023-01-07 | Discharge: 2023-01-07 | Disposition: A | Discharge status: Home / Self Care | Payer: POS | Attending: Emergency Medicine | Admitting: Emergency Medicine

## 2023-01-07 DIAGNOSIS — J029 Acute pharyngitis, unspecified: Secondary | ICD-10-CM | POA: Insufficient documentation

## 2023-01-07 DIAGNOSIS — Z5321 Procedure and treatment not carried out due to patient leaving prior to being seen by health care provider: Secondary | ICD-10-CM | POA: Insufficient documentation

## 2023-01-07 NOTE — ED Notes (Signed)
Pt called 3x on 2 occasions with not response for triage

## 2023-03-21 IMAGING — DX DG ANKLE COMPLETE 3+V*R*
3 series · 3 of 3 positions shown · non-contrast
Comparison: None.

CLINICAL DATA: Twisted ankle, pain

EXAM:
RIGHT ANKLE - COMPLETE 3+ VIEW

[ankle ap]
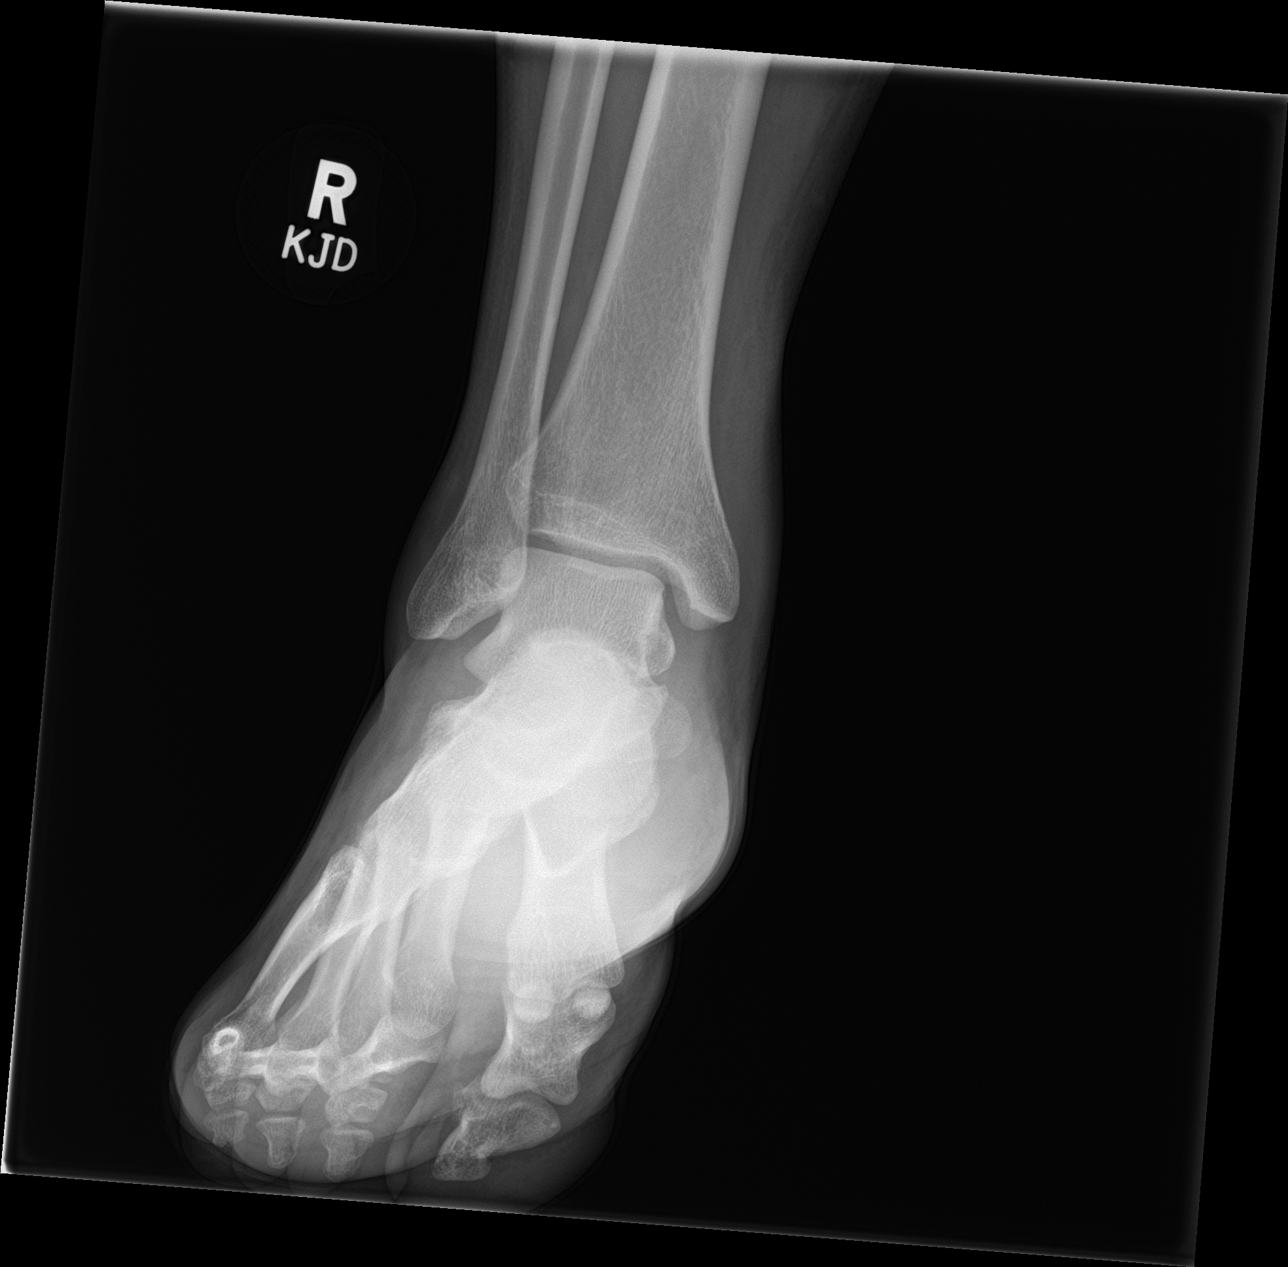

[ankle obl]
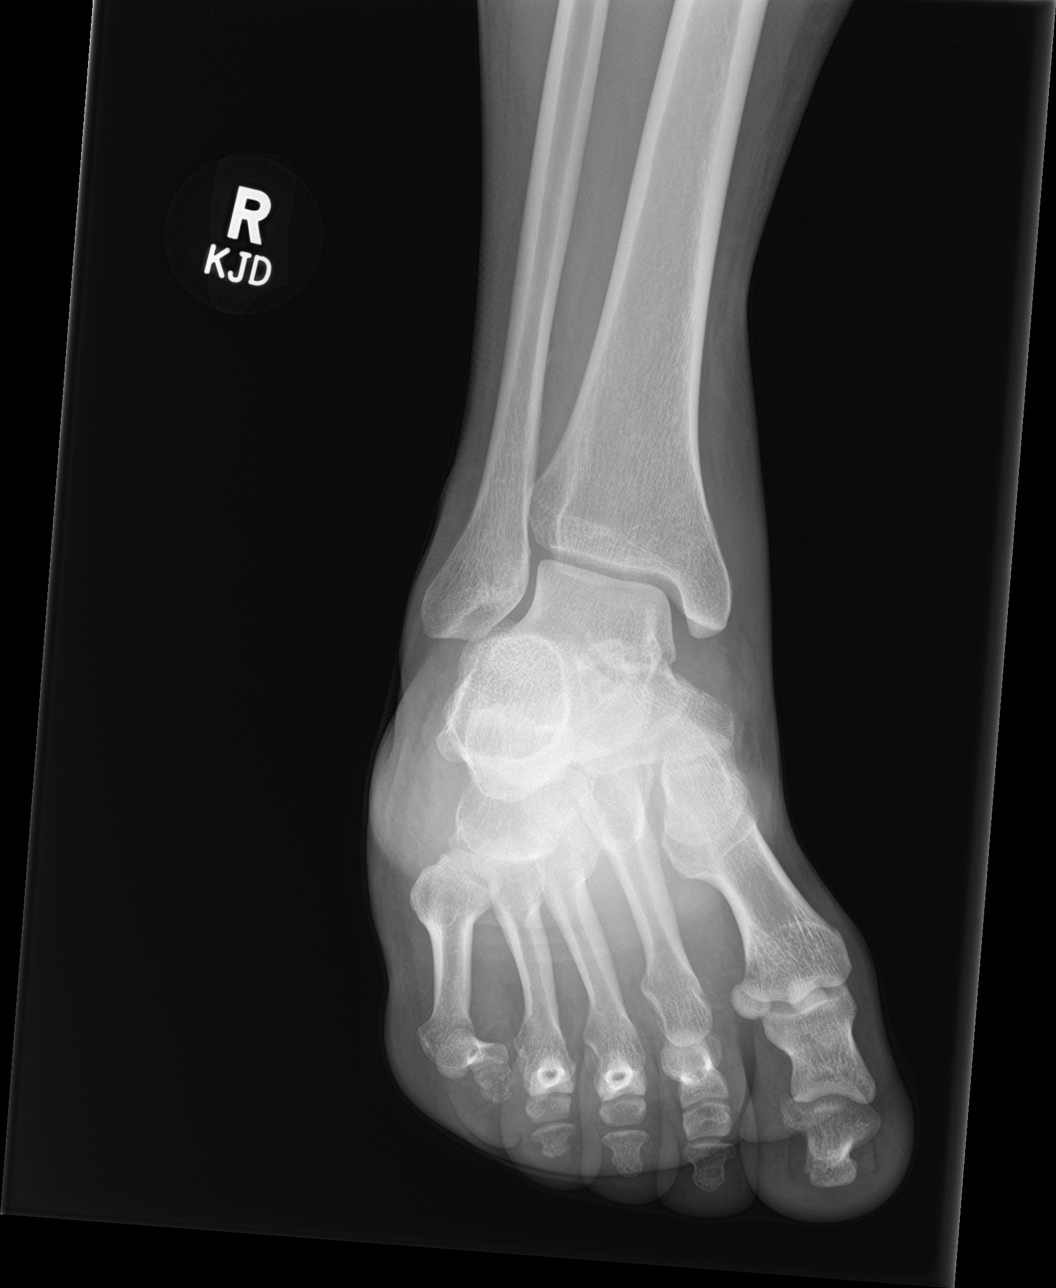

[ankle lat]
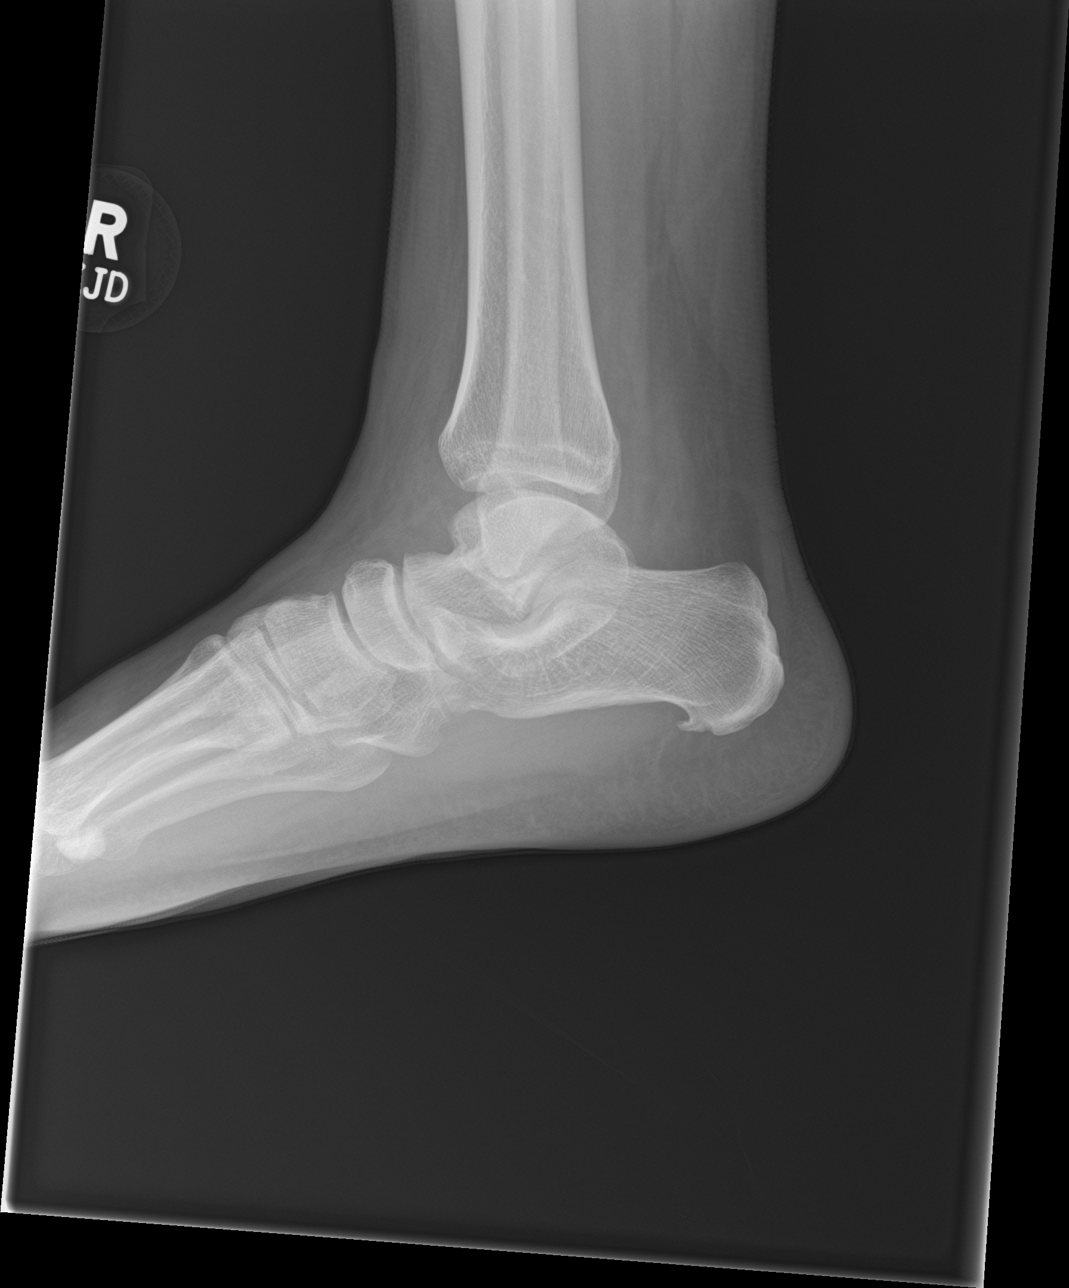

[3 of 3 positions shown; findings below may reference images not displayed]

FINDINGS: No acute bony abnormality. Specifically, no fracture, subluxation,
or dislocation. Plantar calcaneal spur.
IMPRESSION: No acute bony abnormality.

## 2023-07-19 ENCOUNTER — Ambulatory Visit
Admission: RE | Admit: 2023-07-19 | Discharge: 2023-07-19 | Disposition: A | Discharge status: Home / Self Care | Payer: 59 | Source: Ambulatory Visit | Attending: Family Medicine | Admitting: Family Medicine

## 2023-07-19 ENCOUNTER — Other Ambulatory Visit: Payer: Self-pay | Admitting: Family Medicine

## 2023-07-19 DIAGNOSIS — R079 Chest pain, unspecified: Secondary | ICD-10-CM

## 2024-09-03 ENCOUNTER — Ambulatory Visit: Admitting: Physical Medicine and Rehabilitation

## 2024-10-15 ENCOUNTER — Encounter: Payer: Self-pay | Admitting: Radiology
